# Patient Record
Sex: Female | Born: 1995 | Race: White | Hispanic: Yes | Marital: Married | Smoking: Never smoker
Health system: Southern US, Community
[De-identification: ages and names within clinical notes are randomized; demographics above are authoritative.]

## PROBLEM LIST (undated history)

## (undated) DIAGNOSIS — G43909 Migraine, unspecified, not intractable, without status migrainosus: Secondary | ICD-10-CM

## (undated) DIAGNOSIS — R42 Dizziness and giddiness: Secondary | ICD-10-CM

## (undated) HISTORY — DX: Dizziness and giddiness: R42

## (undated) HISTORY — DX: Migraine, unspecified, not intractable, without status migrainosus: G43.909

## (undated) HISTORY — PX: OTHER SURGICAL HISTORY: SHX169

---
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
## 2014-01-08 ENCOUNTER — Ambulatory Visit: Payer: BC Managed Care – PPO | Admitting: Neurology

## 2014-01-15 ENCOUNTER — Ambulatory Visit
Admission: RE | Admit: 2014-01-15 | Discharge: 2014-01-15 | Disposition: A | Discharge status: Home / Self Care | Payer: BC Managed Care – PPO | Source: Ambulatory Visit | Attending: Otolaryngology | Admitting: Otolaryngology

## 2014-01-15 ENCOUNTER — Ambulatory Visit (INDEPENDENT_AMBULATORY_CARE_PROVIDER_SITE_OTHER): Payer: BC Managed Care – PPO | Admitting: Women's Health

## 2014-01-15 ENCOUNTER — Encounter: Payer: Self-pay | Admitting: Women's Health

## 2014-01-15 ENCOUNTER — Other Ambulatory Visit: Payer: Self-pay | Admitting: Otolaryngology

## 2014-01-15 VITALS — BP 120/80 | Ht 63.0 in | Wt 134.0 lb

## 2014-01-15 DIAGNOSIS — B3731 Acute candidiasis of vulva and vagina: Secondary | ICD-10-CM

## 2014-01-15 DIAGNOSIS — Z30018 Encounter for initial prescription of other contraceptives: Secondary | ICD-10-CM

## 2014-01-15 DIAGNOSIS — N898 Other specified noninflammatory disorders of vagina: Secondary | ICD-10-CM

## 2014-01-15 DIAGNOSIS — J328 Other chronic sinusitis: Secondary | ICD-10-CM

## 2014-01-15 DIAGNOSIS — Z01419 Encounter for gynecological examination (general) (routine) without abnormal findings: Secondary | ICD-10-CM

## 2014-01-15 DIAGNOSIS — B373 Candidiasis of vulva and vagina: Secondary | ICD-10-CM

## 2014-01-15 LAB — CBC WITH DIFFERENTIAL/PLATELET
BASOS PCT: 0 % (ref 0–1)
Basophils Absolute: 0 10*3/uL (ref 0.0–0.1)
Eosinophils Absolute: 0.1 10*3/uL (ref 0.0–0.7)
Eosinophils Relative: 2 % (ref 0–5)
HEMATOCRIT: 40.2 % (ref 36.0–46.0)
Hemoglobin: 13.2 g/dL (ref 12.0–15.0)
Lymphocytes Relative: 33 % (ref 12–46)
Lymphs Abs: 2.4 10*3/uL (ref 0.7–4.0)
MCH: 28.4 pg (ref 26.0–34.0)
MCHC: 32.8 g/dL (ref 30.0–36.0)
MCV: 86.5 fL (ref 78.0–100.0)
MONO ABS: 0.5 10*3/uL (ref 0.1–1.0)
Monocytes Relative: 7 % (ref 3–12)
NEUTROS ABS: 4.2 10*3/uL (ref 1.7–7.7)
Neutrophils Relative %: 58 % (ref 43–77)
Platelets: 263 10*3/uL (ref 150–400)
RBC: 4.65 MIL/uL (ref 3.87–5.11)
RDW: 12.9 % (ref 11.5–15.5)
WBC: 7.3 10*3/uL (ref 4.0–10.5)

## 2014-01-15 LAB — WET PREP FOR TRICH, YEAST, CLUE: Trich, Wet Prep: NONE SEEN

## 2014-01-15 MED ORDER — NORELGESTROMIN-ETH ESTRADIOL 150-35 MCG/24HR TD PTWK: 1.0000 | MEDICATED_PATCH | TRANSDERMAL | Status: DC

## 2014-01-15 MED ORDER — FLUCONAZOLE 150 MG PO TABS: 150.0000 mg | ORAL_TABLET | Freq: Once | ORAL | Status: DC

## 2014-01-15 NOTE — Progress Notes (Signed)
[facility] 12/20/1995 [id number]    History:    Presents for annual exam.  Regular monthly cycle on OCs. Migraines that  are now relating more to sinus problems. Same partner. Completed gardasil series.  Past medical history, past surgical history, family history and social history were all reviewed and documented in the EPIC chart. [employer] nursing major. Parents healthy.  ROS:  A  12 point ROS was performed and pertinent positives and negatives are included.  Exam:  Filed Vitals:   01/15/14 1142  BP: 120/80    General appearance:  Normal Thyroid:  Symmetrical, normal in size, without palpable masses or nodularity. Respiratory  Auscultation:  Clear without wheezing or rhonchi Cardiovascular  Auscultation:  Regular rate, without rubs, murmurs or gallops  Edema/varicosities:  Not grossly evident Abdominal  Soft,nontender, without masses, guarding or rebound.  Liver/spleen:  No organomegaly noted  Hernia:  None appreciated  Skin  Inspection:  Grossly normal   Breasts: Examined lying and sitting.     Right: Without masses, retractions, discharge or axillary adenopathy.     Left: Without masses, retractions, discharge or axillary adenopathy. Gentitourinary   Inguinal/mons:  Normal without inguinal adenopathy  External genitalia:  Normal  BUS/Urethra/Skene's glands:  Normal  Vagina: erythematous with curdy discharge, wet prep positive for yeast, few clues  Cervix:  Normal  Uterus:   normal in size, shape and contour.  Midline and mobile  Adnexa/parametria:     Rt: Without masses or tenderness.   Lt: Without masses or tenderness.  Anus and perineum: Normal   Assessment/Plan:  18 y.o. SHFG0 for annual exam with complaint of occasional vaginal irritation, no odor, minimal itching.  Yeast vaginitis Headaches Contraception management  Plan: Diflucan 150 by mouth times one dose with refill, instructed to call if no relief of discomfort. Contraception options reviewed  will try Ortho Evra patch one patch weekly for 3 weeks, prescription, proper use given and reviewed slight risk for blood clots and strokes with history of migraines. SBE's, regular exercise, calcium rich diet, MVI daily encouraged. CBC, UHarrington Challenger.   Mahad Newstrom J WHNP, 12:21 PM 01/15/2014

## 2014-01-15 NOTE — Patient Instructions (Signed)

## 2015-01-20 ENCOUNTER — Encounter: Payer: Self-pay | Admitting: Women's Health

## 2015-01-24 ENCOUNTER — Other Ambulatory Visit: Payer: Self-pay | Admitting: Women's Health

## 2015-01-27 ENCOUNTER — Ambulatory Visit (INDEPENDENT_AMBULATORY_CARE_PROVIDER_SITE_OTHER): Payer: BLUE CROSS/BLUE SHIELD | Admitting: Women's Health

## 2015-01-27 ENCOUNTER — Encounter: Payer: Self-pay | Admitting: Women's Health

## 2015-01-27 VITALS — BP 124/80 | Ht 63.0 in | Wt 146.0 lb

## 2015-01-27 DIAGNOSIS — Z01419 Encounter for gynecological examination (general) (routine) without abnormal findings: Secondary | ICD-10-CM

## 2015-01-27 DIAGNOSIS — Z30019 Encounter for initial prescription of contraceptives, unspecified: Secondary | ICD-10-CM

## 2015-01-27 LAB — CBC WITH DIFFERENTIAL/PLATELET
BASOS ABS: 0 10*3/uL (ref 0.0–0.1)
Basophils Relative: 0 % (ref 0–1)
Eosinophils Absolute: 0.3 10*3/uL (ref 0.0–0.7)
Eosinophils Relative: 4 % (ref 0–5)
HEMATOCRIT: 38.2 % (ref 36.0–46.0)
Hemoglobin: 12.7 g/dL (ref 12.0–15.0)
LYMPHS ABS: 2.3 10*3/uL (ref 0.7–4.0)
LYMPHS PCT: 34 % (ref 12–46)
MCH: 27.5 pg (ref 26.0–34.0)
MCHC: 33.2 g/dL (ref 30.0–36.0)
MCV: 82.7 fL (ref 78.0–100.0)
MPV: 11.1 fL (ref 8.6–12.4)
Monocytes Absolute: 0.7 10*3/uL (ref 0.1–1.0)
Monocytes Relative: 10 % (ref 3–12)
NEUTROS ABS: 3.5 10*3/uL (ref 1.7–7.7)
NEUTROS PCT: 52 % (ref 43–77)
Platelets: 280 10*3/uL (ref 150–400)
RBC: 4.62 MIL/uL (ref 3.87–5.11)
RDW: 13.3 % (ref 11.5–15.5)
WBC: 6.7 10*3/uL (ref 4.0–10.5)

## 2015-01-27 MED ORDER — NORELGESTROMIN-ETH ESTRADIOL 150-35 MCG/24HR TD PTWK: MEDICATED_PATCH | TRANSDERMAL | Status: DC

## 2015-01-27 NOTE — Patient Instructions (Signed)
Health Maintenance, Female Adopting a healthy lifestyle and getting preventive care can go a long way to promote health and wellness. Talk with your health care provider about what schedule of regular examinations is right for you. This is a good chance for you to check in with your provider about disease prevention and staying healthy. In between checkups, there are plenty of things you can do on your own. Experts have done a lot of research about which lifestyle changes and preventive measures are most likely to keep you healthy. Ask your health care provider for more information. WEIGHT AND DIET  Eat a healthy diet  Be sure to include plenty of vegetables, fruits, low-fat dairy products, and lean protein.  Do not eat a lot of foods high in solid fats, added sugars, or salt.  Get regular exercise. This is one of the most important things you can do for your health.  Most adults should exercise for at least 150 minutes each week. The exercise should increase your heart rate and make you sweat (moderate-intensity exercise).  Most adults should also do strengthening exercises at least twice a week. This is in addition to the moderate-intensity exercise.  Maintain a healthy weight  Body mass index (BMI) is a measurement that can be used to identify possible weight problems. It estimates body fat based on height and weight. Your health care provider can help determine your BMI and help you achieve or maintain a healthy weight.  For females 20 years of age and older:   A BMI below 18.5 is considered underweight.  A BMI of 18.5 to 24.9 is normal.  A BMI of 25 to 29.9 is considered overweight.  A BMI of 30 and above is considered obese.  Watch levels of cholesterol and blood lipids  You should start having your blood tested for lipids and cholesterol at 20 years of age, then have this test every 5 years.  You may need to have your cholesterol levels checked more often if:  Your lipid  or cholesterol levels are high.  You are older than 20 years of age.  You are at high risk for heart disease.  CANCER SCREENING   Lung Cancer  Lung cancer screening is recommended for adults 55-80 years old who are at high risk for lung cancer because of a history of smoking.  A yearly low-dose CT scan of the lungs is recommended for people who:  Currently smoke.  Have quit within the past 15 years.  Have at least a 30-pack-year history of smoking. A pack year is smoking an average of one pack of cigarettes a day for 1 year.  Yearly screening should continue until it has been 15 years since you quit.  Yearly screening should stop if you develop a health problem that would prevent you from having lung cancer treatment.  Breast Cancer  Practice breast self-awareness. This means understanding how your breasts normally appear and feel.  It also means doing regular breast self-exams. Let your health care provider know about any changes, no matter how small.  If you are in your 20s or 30s, you should have a clinical breast exam (CBE) by a health care provider every 1-3 years as part of a regular health exam.  If you are 40 or older, have a CBE every year. Also consider having a breast X-ray (mammogram) every year.  If you have a family history of breast cancer, talk to your health care provider about genetic screening.  If you   are at high risk for breast cancer, talk to your health care provider about having an MRI and a mammogram every year.  Breast cancer gene (BRCA) assessment is recommended for women who have family members with BRCA-related cancers. BRCA-related cancers include:  Breast.  Ovarian.  Tubal.  Peritoneal cancers.  Results of the assessment will determine the need for genetic counseling and BRCA1 and BRCA2 testing. Cervical Cancer Your health care provider may recommend that you be screened regularly for cancer of the pelvic organs (ovaries, uterus, and  vagina). This screening involves a pelvic examination, including checking for microscopic changes to the surface of your cervix (Pap test). You may be encouraged to have this screening done every 3 years, beginning at age 42.  For women ages 68-65, health care providers may recommend pelvic exams and Pap testing every 3 years, or they may recommend the Pap and pelvic exam, combined with testing for human papilloma virus (HPV), every 5 years. Some types of HPV increase your risk of cervical cancer. Testing for HPV may also be done on women of any age with unclear Pap test results.  Other health care providers may not recommend any screening for nonpregnant women who are considered low risk for pelvic cancer and who do not have symptoms. Ask your health care provider if a screening pelvic exam is right for you.  If you have had past treatment for cervical cancer or a condition that could lead to cancer, you need Pap tests and screening for cancer for at least 20 years after your treatment. If Pap tests have been discontinued, your risk factors (such as having a new sexual partner) need to be reassessed to determine if screening should resume. Some women have medical problems that increase the chance of getting cervical cancer. In these cases, your health care provider may recommend more frequent screening and Pap tests. Colorectal Cancer  This type of cancer can be detected and often prevented.  Routine colorectal cancer screening usually begins at 20 years of age and continues through 19 years of age.  Your health care provider may recommend screening at an earlier age if you have risk factors for colon cancer.  Your health care provider may also recommend using home test kits to check for hidden blood in the stool.  A small camera at the end of a tube can be used to examine your colon directly (sigmoidoscopy or colonoscopy). This is done to check for the earliest forms of colorectal  cancer.  Routine screening usually begins at age 22.  Direct examination of the colon should be repeated every 5-10 years through 21 years of age. However, you may need to be screened more often if early forms of precancerous polyps or small growths are found. Skin Cancer  Check your skin from head to toe regularly.  Tell your health care provider about any new moles or changes in moles, especially if there is a change in a mole's shape or color.  Also tell your health care provider if you have a mole that is larger than the size of a pencil eraser.  Always use sunscreen. Apply sunscreen liberally and repeatedly throughout the day.  Protect yourself by wearing long sleeves, pants, a wide-brimmed hat, and sunglasses whenever you are outside. HEART DISEASE, DIABETES, AND HIGH BLOOD PRESSURE   High blood pressure causes heart disease and increases the risk of stroke. High blood pressure is more likely to develop in:  People who have blood pressure in the high end  of the normal range (130-139/85-89 mm Hg).  People who are overweight or obese.  People who are African American.  If you are 38-23 years of age, have your blood pressure checked every 3-5 years. If you are 61 years of age or older, have your blood pressure checked every year. You should have your blood pressure measured twice--once when you are at a hospital or clinic, and once when you are not at a hospital or clinic. Record the average of the two measurements. To check your blood pressure when you are not at a hospital or clinic, you can use:  An automated blood pressure machine at a pharmacy.  A home blood pressure monitor.  If you are between 45 years and 39 years old, ask your health care provider if you should take aspirin to prevent strokes.  Have regular diabetes screenings. This involves taking a blood sample to check your fasting blood sugar level.  If you are at a normal weight and have a low risk for diabetes,  have this test once every three years after 20 years of age.  If you are overweight and have a high risk for diabetes, consider being tested at a younger age or more often. PREVENTING INFECTION  Hepatitis B  If you have a higher risk for hepatitis B, you should be screened for this virus. You are considered at high risk for hepatitis B if:  You were born in a country where hepatitis B is common. Ask your health care provider which countries are considered high risk.  Your parents were born in a high-risk country, and you have not been immunized against hepatitis B (hepatitis B vaccine).  You have HIV or AIDS.  You use needles to inject street drugs.  You live with someone who has hepatitis B.  You have had sex with someone who has hepatitis B.  You get hemodialysis treatment.  You take certain medicines for conditions, including cancer, organ transplantation, and autoimmune conditions. Hepatitis C  Blood testing is recommended for:  Everyone born from [birth year] through 1966.  Anyone with known risk factors for hepatitis C. Sexually transmitted infections (STIs)  You should be screened for sexually transmitted infections (STIs) including gonorrhea and chlamydia if:  You are sexually active and are younger than 20 years of age.  You are older than 20 years of age and your health care provider tells you that you are at risk for this type of infection.  Your sexual activity has changed since you were last screened and you are at an increased risk for chlamydia or gonorrhea. Ask your health care provider if you are at risk.  If you do not have HIV, but are at risk, it may be recommended that you take a prescription medicine daily to prevent HIV infection. This is called pre-exposure prophylaxis (PrEP). You are considered at risk if:  You are sexually active and do not regularly use condoms or know the HIV status of your partner(s).  You take drugs by injection.  You are sexually  active with a partner who has HIV. Talk with your health care provider about whether you are at high risk of being infected with HIV. If you choose to begin PrEP, you should first be tested for HIV. You should then be tested every 3 months for as long as you are taking PrEP.  PREGNANCY   If you are premenopausal and you may become pregnant, ask your health care provider about preconception counseling.  If you may  become pregnant, take 400 to 800 micrograms (mcg) of folic acid every day.  If you want to prevent pregnancy, talk to your health care provider about birth control (contraception). OSTEOPOROSIS AND MENOPAUSE   Osteoporosis is a disease in which the bones lose minerals and strength with aging. This can result in serious bone fractures. Your risk for osteoporosis can be identified using a bone density scan.  If you are 61 years of age or older, or if you are at risk for osteoporosis and fractures, ask your health care provider if you should be screened.  Ask your health care provider whether you should take a calcium or vitamin D supplement to lower your risk for osteoporosis.  Menopause may have certain physical symptoms and risks.  Hormone replacement therapy may reduce some of these symptoms and risks. Talk to your health care provider about whether hormone replacement therapy is right for you.  HOME CARE INSTRUCTIONS   Schedule regular health, dental, and eye exams.  Stay current with your immunizations.   Do not use any tobacco products including cigarettes, chewing tobacco, or electronic cigarettes.  If you are pregnant, do not drink alcohol.  If you are breastfeeding, limit how much and how often you drink alcohol.  Limit alcohol intake to no more than 1 drink per day for nonpregnant women. One drink equals 12 ounces of beer, 5 ounces of wine, or 1 ounces of hard liquor.  Do not use street drugs.  Do not share needles.  Ask your health care provider for help if  you need support or information about quitting drugs.  Tell your health care provider if you often feel depressed.  Tell your health care provider if you have ever been abused or do not feel safe at home.   This information is not intended to replace advice given to you by your health care provider. Make sure you discuss any questions you have with your health care provider.   Document Released: 09/11/2010 Document Revised: 03/19/2014 Document Reviewed: 01/28/2013 Elsevier Interactive Patient Education [employer].

## 2015-01-27 NOTE — Progress Notes (Signed)
Tonya Huynh Tonya Huynh 03/14/1995 [id number]    History:    Presents for annual exam.  Monthly cycle on Ortho Evra patch without complaint. Same partner/denies need for STD screen. Gardasil series completed.  Past medical history, past surgical history, family history and social history were all reviewed and documented in the EPIC chart. [facility] [employer] [facility] well. Parents healthy.  ROS:  A ROS was performed and pertinent positives and negatives are included.  Exam:  Filed Vitals:   01/27/15 0830  BP: 124/80    General appearance:  Normal Thyroid:  Symmetrical, normal in size, without palpable masses or nodularity. Respiratory  Auscultation:  Clear without wheezing or rhonchi Cardiovascular  Auscultation:  Regular rate, without rubs, murmurs or gallops  Edema/varicosities:  Not grossly evident Abdominal  Soft,nontender, without masses, guarding or rebound.  Liver/spleen:  No organomegaly noted  Hernia:  None appreciated  Skin  Inspection:  Grossly normal   Breasts: Examined lying and sitting.     Right: Without masses, retractions, discharge or axillary adenopathy.     Left: Without masses, retractions, discharge or axillary adenopathy. Gentitourinary   Inguinal/mons:  Normal without inguinal adenopathy  External genitalia:  Normal  BUS/Urethra/Skene's glands:  Normal  Vagina:  Normal  Cervix:  Normal  Uterus:  normal in size, shape and contour.  Midline and mobile  Adnexa/parametria:     Rt: Without masses or tenderness.   Lt: Without masses or tenderness.  Anus and perineum: Normal    Assessment/Plan:  19 y.o. SHF  G0 for annual exam with complaint of questionable discoloration of left hand.  Monthly cycle on Ortho Evra patch/same partner  Plan: Ortho Evra patch prescription, proper use, slight risk for blood clots and strokes reviewed, condoms encouraged until permanent partner. SBE's, regular exercise, calcium rich diet, decrease calories for weight loss  encouraged, has gained 15 pounds in the past year. CBC, UA, Flat, pinpoint light brown discoloration without itching or discomfort on left hand may be more sun related. Encouraged sunscreen on hands.    Harrington ChallengerYOUNG,NANCY J [facility] HuntingtonWHNP, 20:09 AM 01/27/2015

## 2015-01-28 LAB — URINALYSIS W MICROSCOPIC + REFLEX CULTURE
BILIRUBIN URINE: NEGATIVE
Casts: NONE SEEN [LPF]
Crystals: NONE SEEN [HPF]
GLUCOSE, UA: NEGATIVE
HGB URINE DIPSTICK: NEGATIVE
Ketones, ur: NEGATIVE
LEUKOCYTES UA: NEGATIVE
NITRITE: NEGATIVE
PH: 6 (ref 5.0–8.0)
PROTEIN: NEGATIVE
Specific Gravity, Urine: 1.022 (ref 1.001–1.035)
YEAST: NONE SEEN [HPF]

## 2015-01-29 LAB — URINE CULTURE

## 2015-04-22 ENCOUNTER — Other Ambulatory Visit: Payer: Self-pay | Admitting: Women's Health

## 2015-06-16 IMAGING — CR DG SINUSES COMPLETE 3+V
3 series · 3 of 3 positions shown · non-contrast
Comparison: None.

CLINICAL DATA: Chronic sinusitis ; 1 history of maxillary sinus
region pain, more severe on the left than on the right

EXAM:
PARANASAL SINUSES - COMPLETE 3 + VIEW

[[person_name]]
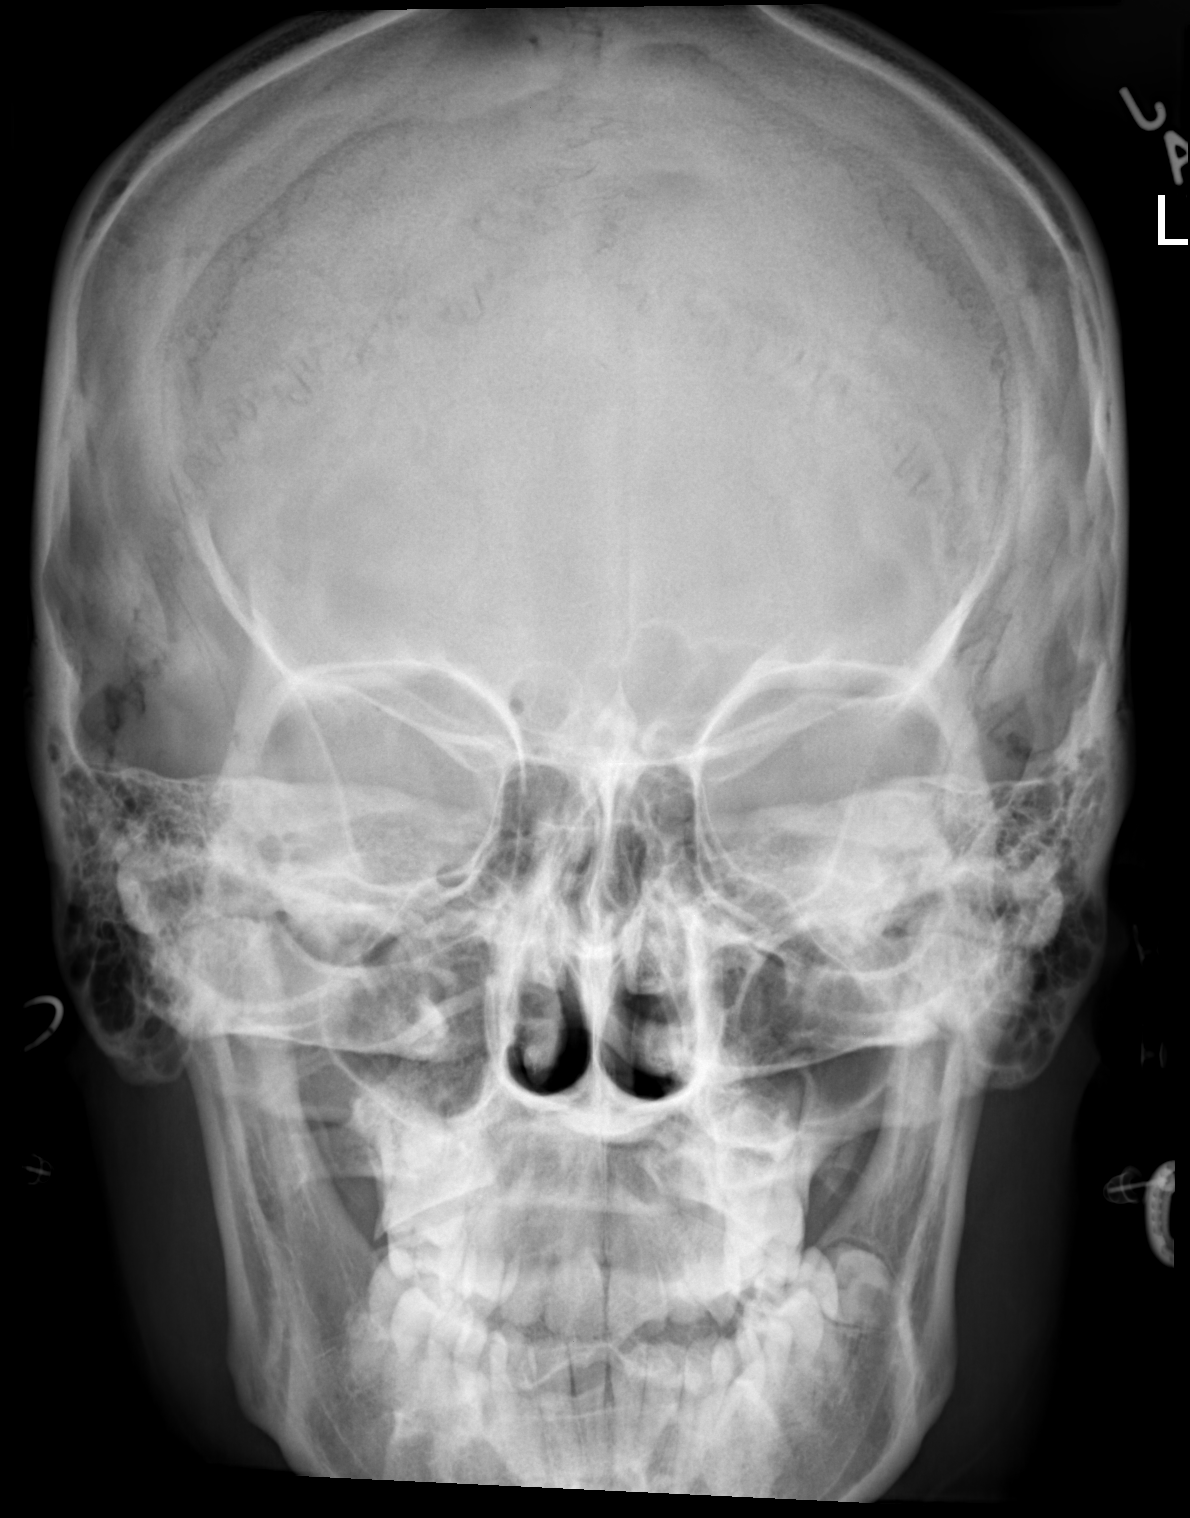

[w waters]
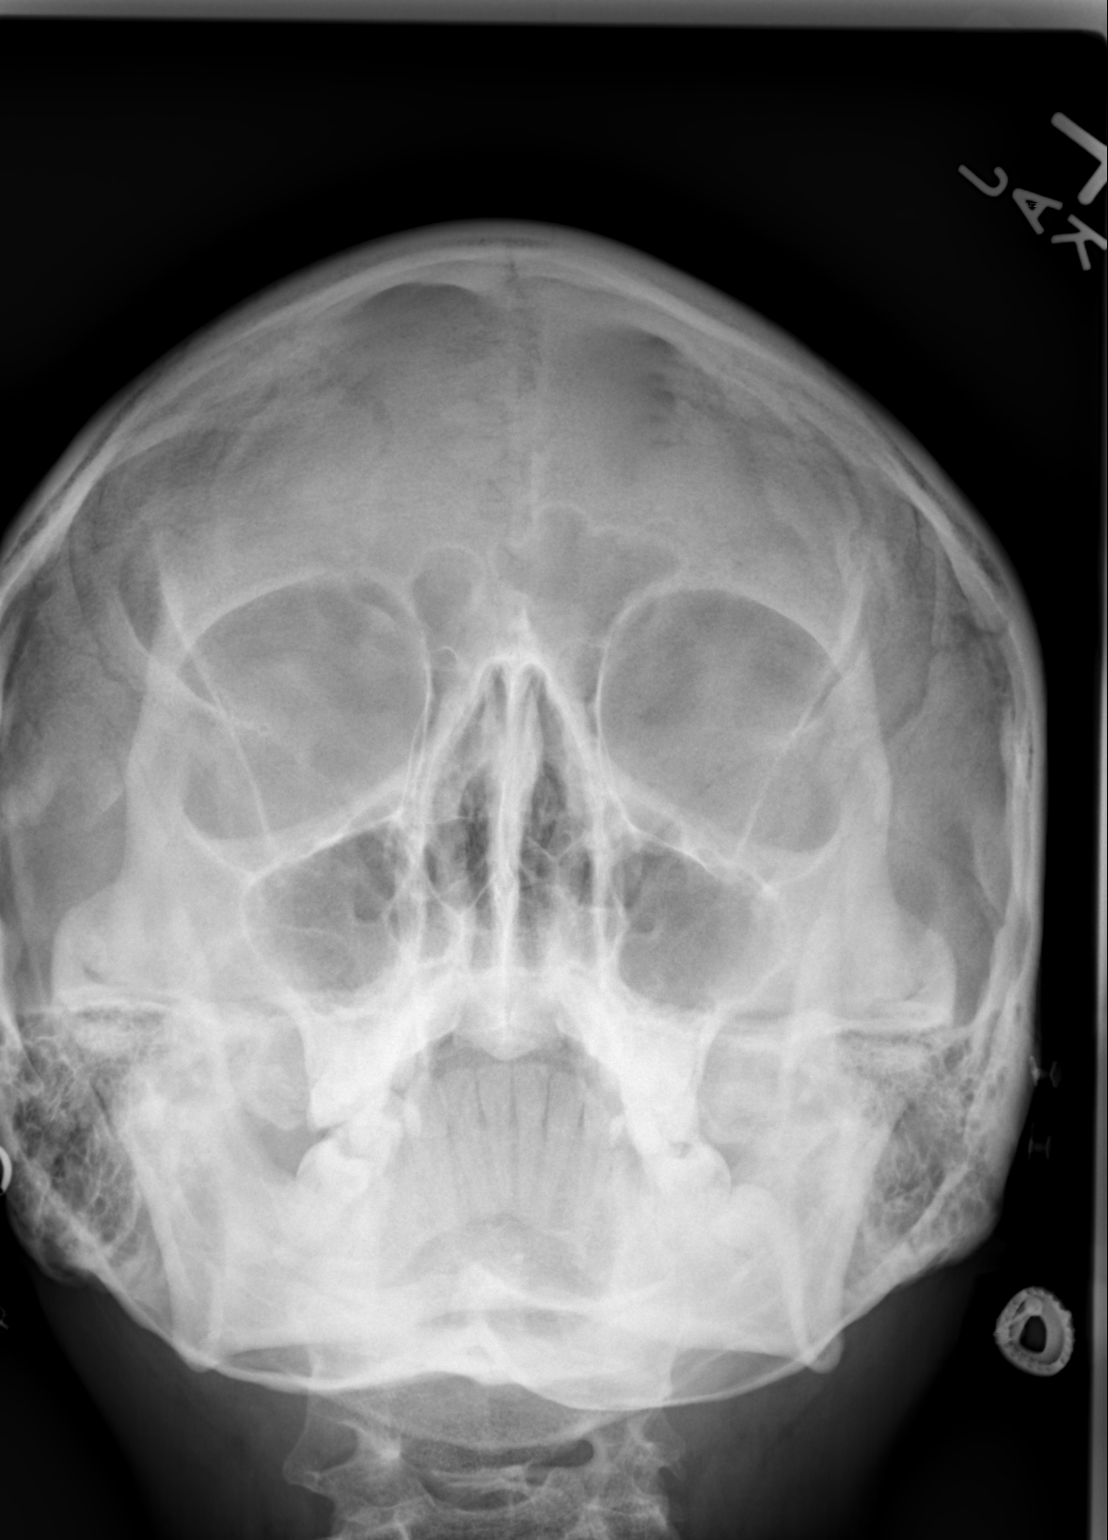

[w skull lat]
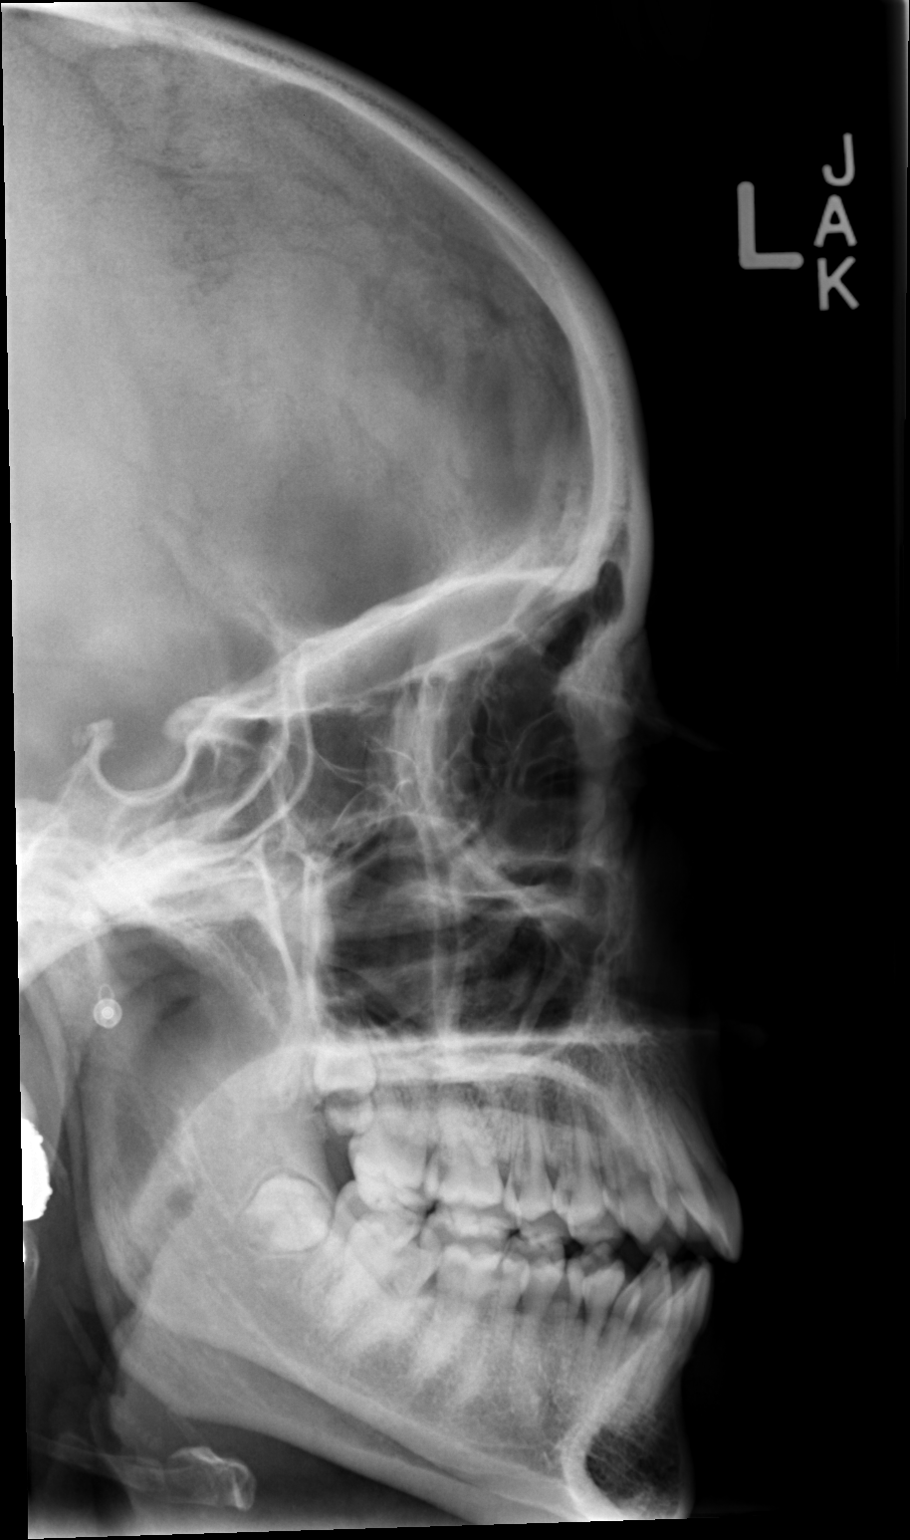

[3 of 3 positions shown; findings below may reference images not displayed]

FINDINGS: Ceejay, Paulus N, and lateral views were obtained. Frontal sinuses
are rather hypoplastic. Aerated paranasal sinuses are clear. There
is no air-fluid level. No bony destruction or expansion. Nasal
septum is midline.
IMPRESSION: Aerated paranasal sinuses clear.  No bony destruction or expansion.

## 2016-01-04 ENCOUNTER — Telehealth: Payer: Self-pay | Admitting: *Deleted

## 2016-01-04 NOTE — Telephone Encounter (Signed)
[place] asked if we carry in vaccinations needed for trip.I explained to pt best to contact local health department to get this information as they have the most up to date information.

## 2016-01-31 ENCOUNTER — Encounter: Payer: Self-pay | Admitting: Women's Health

## 2016-01-31 ENCOUNTER — Ambulatory Visit (INDEPENDENT_AMBULATORY_CARE_PROVIDER_SITE_OTHER): Payer: BLUE CROSS/BLUE SHIELD | Admitting: Women's Health

## 2016-01-31 VITALS — BP 122/78 | Ht 63.0 in | Wt 144.0 lb

## 2016-01-31 DIAGNOSIS — Z30016 Encounter for initial prescription of transdermal patch hormonal contraceptive device: Secondary | ICD-10-CM

## 2016-01-31 DIAGNOSIS — B9689 Other specified bacterial agents as the cause of diseases classified elsewhere: Secondary | ICD-10-CM | POA: Diagnosis not present

## 2016-01-31 DIAGNOSIS — Z113 Encounter for screening for infections with a predominantly sexual mode of transmission: Secondary | ICD-10-CM

## 2016-01-31 DIAGNOSIS — N898 Other specified noninflammatory disorders of vagina: Secondary | ICD-10-CM

## 2016-01-31 DIAGNOSIS — N760 Acute vaginitis: Secondary | ICD-10-CM | POA: Diagnosis not present

## 2016-01-31 DIAGNOSIS — Z01419 Encounter for gynecological examination (general) (routine) without abnormal findings: Secondary | ICD-10-CM | POA: Diagnosis not present

## 2016-01-31 LAB — CBC WITH DIFFERENTIAL/PLATELET
BASOS PCT: 0 %
Basophils Absolute: 0 cells/uL (ref 0–200)
Eosinophils Absolute: 201 cells/uL (ref 15–500)
Eosinophils Relative: 3 %
HEMATOCRIT: 35.6 % (ref 35.0–45.0)
HEMOGLOBIN: 11.8 g/dL (ref 11.7–15.5)
LYMPHS ABS: 2479 {cells}/uL (ref 850–3900)
LYMPHS PCT: 37 %
MCH: 28 pg (ref 27.0–33.0)
MCHC: 33.1 g/dL (ref 32.0–36.0)
MCV: 84.6 fL (ref 80.0–100.0)
MONO ABS: 603 {cells}/uL (ref 200–950)
MPV: 11.1 fL (ref 7.5–12.5)
Monocytes Relative: 9 %
Neutro Abs: 3417 cells/uL (ref 1500–7800)
Neutrophils Relative %: 51 %
Platelets: 251 10*3/uL (ref 140–400)
RBC: 4.21 MIL/uL (ref 3.80–5.10)
RDW: 13.4 % (ref 11.0–15.0)
WBC: 6.7 10*3/uL (ref 3.8–10.8)

## 2016-01-31 LAB — WET PREP FOR TRICH, YEAST, CLUE
Trich, Wet Prep: NONE SEEN
Yeast Wet Prep HPF POC: NONE SEEN

## 2016-01-31 LAB — HIV ANTIBODY (ROUTINE TESTING W REFLEX): HIV: NONREACTIVE

## 2016-01-31 MED ORDER — METRONIDAZOLE 0.75 % VA GEL: VAGINAL | 0 refills | Status: DC

## 2016-01-31 MED ORDER — NORELGESTROMIN-ETH ESTRADIOL 150-35 MCG/24HR TD PTWK: MEDICATED_PATCH | TRANSDERMAL | 4 refills | Status: DC

## 2016-01-31 MED ORDER — FLUCONAZOLE 150 MG PO TABS: 150.0000 mg | ORAL_TABLET | Freq: Once | ORAL | 1 refills | Status: AC

## 2016-01-31 NOTE — Patient Instructions (Addendum)
Health Maintenance, Female Introduction Adopting a healthy lifestyle and getting preventive care can go a long way to promote health and wellness. Talk with your health care provider about what schedule of regular examinations is right for you. This is a good chance for you to check in with your provider about disease prevention and staying healthy. In between checkups, there are plenty of things you can do on your own. Experts have done a lot of research about which lifestyle changes and preventive measures are most likely to keep you healthy. Ask your health care provider for more information. Weight and diet Eat a healthy diet  Be sure to include plenty of vegetables, fruits, low-fat dairy products, and lean protein.  Do not eat a lot of foods high in solid fats, added sugars, or salt.  Get regular exercise. This is one of the most important things you can do for your health.  Most adults should exercise for at least 150 minutes each week. The exercise should increase your heart rate and make you sweat (moderate-intensity exercise).  Most adults should also do strengthening exercises at least twice a week. This is in addition to the moderate-intensity exercise. Maintain a healthy weight  Body mass index (BMI) is a measurement that can be used to identify possible weight problems. It estimates body fat based on height and weight. Your health care provider can help determine your BMI and help you achieve or maintain a healthy weight.  For females 50 years of age and older:  A BMI below 18.5 is considered underweight.  A BMI of 18.5 to 24.9 is normal.  A BMI of 25 to 29.9 is considered overweight.  A BMI of 30 and above is considered obese. Watch levels of cholesterol and blood lipids  You should start having your blood tested for lipids and cholesterol at 21 years of age, then have this test every 5 years.  You may need to have your cholesterol levels checked more often if:  Your  lipid or cholesterol levels are high.  You are older than 21 years of age.  You are at high risk for heart disease. Cancer screening Lung Cancer  Lung cancer screening is recommended for adults 20-30 years old who are at high risk for lung cancer because of a history of smoking.  A yearly low-dose CT scan of the lungs is recommended for people who:  Currently smoke.  Have quit within the past 15 years.  Have at least a 30-pack-year history of smoking. A pack year is smoking an average of one pack of cigarettes a day for 1 year.  Yearly screening should continue until it has been 15 years since you quit.  Yearly screening should stop if you develop a health problem that would prevent you from having lung cancer treatment. Breast Cancer  Practice breast self-awareness. This means understanding how your breasts normally appear and feel.  It also means doing regular breast self-exams. Let your health care provider know about any changes, no matter how small.  If you are in your 20s or 30s, you should have a clinical breast exam (CBE) by a health care provider every 1-3 years as part of a regular health exam.  If you are 74 or older, have a CBE every year. Also consider having a breast X-ray (mammogram) every year.  If you have a family history of breast cancer, talk to your health care provider about genetic screening.  If you are at high risk for breast cancer,  talk to your health care provider about having an MRI and a mammogram every year.  Breast cancer gene (BRCA) assessment is recommended for women who have family members with BRCA-related cancers. BRCA-related cancers include:  Breast.  Ovarian.  Tubal.  Peritoneal cancers.  Results of the assessment will determine the need for genetic counseling and BRCA1 and BRCA2 testing. Cervical Cancer  Your health care provider may recommend that you be screened regularly for cancer of the pelvic organs (ovaries, uterus, and  vagina). This screening involves a pelvic examination, including checking for microscopic changes to the surface of your cervix (Pap test). You may be encouraged to have this screening done every 3 years, beginning at age 106.  For women ages 49-65, health care providers may recommend pelvic exams and Pap testing every 3 years, or they may recommend the Pap and pelvic exam, combined with testing for human papilloma virus (HPV), every 5 years. Some types of HPV increase your risk of cervical cancer. Testing for HPV may also be done on women of any age with unclear Pap test results.  Other health care providers may not recommend any screening for nonpregnant women who are considered low risk for pelvic cancer and who do not have symptoms. Ask your health care provider if a screening pelvic exam is right for you.  If you have had past treatment for cervical cancer or a condition that could lead to cancer, you need Pap tests and screening for cancer for at least 20 years after your treatment. If Pap tests have been discontinued, your risk factors (such as having a new sexual partner) need to be reassessed to determine if screening should resume. Some women have medical problems that increase the chance of getting cervical cancer. In these cases, your health care provider may recommend more frequent screening and Pap tests. Colorectal Cancer  This type of cancer can be detected and often prevented.  Routine colorectal cancer screening usually begins at 21 years of age and continues through 21 years of age.  Your health care provider may recommend screening at an earlier age if you have risk factors for colon cancer.  Your health care provider may also recommend using home test kits to check for hidden blood in the stool.  A small camera at the end of a tube can be used to examine your colon directly (sigmoidoscopy or colonoscopy). This is done to check for the earliest forms of colorectal  cancer.  Routine screening usually begins at age 54.  Direct examination of the colon should be repeated every 5-10 years through 21 years of age. However, you may need to be screened more often if early forms of precancerous polyps or small growths are found. Skin Cancer  Check your skin from head to toe regularly.  Tell your health care provider about any new moles or changes in moles, especially if there is a change in a mole's shape or color.  Also tell your health care provider if you have a mole that is larger than the size of a pencil eraser.  Always use sunscreen. Apply sunscreen liberally and repeatedly throughout the day.  Protect yourself by wearing long sleeves, pants, a wide-brimmed hat, and sunglasses whenever you are outside. Heart disease, diabetes, and high blood pressure  High blood pressure causes heart disease and increases the risk of stroke. High blood pressure is more likely to develop in:  People who have blood pressure in the high end of the normal range (130-139/85-89 mm Hg).  People who are overweight or obese.  People who are African American.  If you are 18-39 years of age, have your blood pressure checked every 3-5 years. If you are 40 years of age or older, have your blood pressure checked every year. You should have your blood pressure measured twice-once when you are at a hospital or clinic, and once when you are not at a hospital or clinic. Record the average of the two measurements. To check your blood pressure when you are not at a hospital or clinic, you can use:  An automated blood pressure machine at a pharmacy.  A home blood pressure monitor.  If you are between 55 years and 79 years old, ask your health care provider if you should take aspirin to prevent strokes.  Have regular diabetes screenings. This involves taking a blood sample to check your fasting blood sugar level.  If you are at a normal weight and have a low risk for diabetes,  have this test once every three years after 21 years of age.  If you are overweight and have a high risk for diabetes, consider being tested at a younger age or more often. Preventing infection Hepatitis B  If you have a higher risk for hepatitis B, you should be screened for this virus. You are considered at high risk for hepatitis B if:  You were born in a country where hepatitis B is common. Ask your health care provider which countries are considered high risk.  Your parents were born in a high-risk country, and you have not been immunized against hepatitis B (hepatitis B vaccine).  You have HIV or AIDS.  You use needles to inject street drugs.  You live with someone who has hepatitis B.  You have had sex with someone who has hepatitis B.  You get hemodialysis treatment.  You take certain medicines for conditions, including cancer, organ transplantation, and autoimmune conditions. Hepatitis C  Blood testing is recommended for:  Everyone born from 1945 through 1965.  Anyone with known risk factors for hepatitis C. Sexually transmitted infections (STIs)  You should be screened for sexually transmitted infections (STIs) including gonorrhea and chlamydia if:  You are sexually active and are younger than 21 years of age.  You are older than 21 years of age and your health care provider tells you that you are at risk for this type of infection.  Your sexual activity has changed since you were last screened and you are at an increased risk for chlamydia or gonorrhea. Ask your health care provider if you are at risk.  If you do not have HIV, but are at risk, it may be recommended that you take a prescription medicine daily to prevent HIV infection. This is called pre-exposure prophylaxis (PrEP). You are considered at risk if:  You are sexually active and do not regularly use condoms or know the HIV status of your partner(s).  You take drugs by injection.  You are sexually  active with a partner who has HIV. Talk with your health care provider about whether you are at high risk of being infected with HIV. If you choose to begin PrEP, you should first be tested for HIV. You should then be tested every 3 months for as long as you are taking PrEP. Pregnancy  If you are premenopausal and you may become pregnant, ask your health care provider about preconception counseling.  If you may become pregnant, take 400 to 800 micrograms (mcg) of folic acid   every day.  If you want to prevent pregnancy, talk to your health care provider about birth control (contraception). Osteoporosis and menopause  Osteoporosis is a disease in which the bones lose minerals and strength with aging. This can result in serious bone fractures. Your risk for osteoporosis can be identified using a bone density scan.  If you are 73 years of age or older, or if you are at risk for osteoporosis and fractures, ask your health care provider if you should be screened.  Ask your health care provider whether you should take a calcium or vitamin D supplement to lower your risk for osteoporosis.  Menopause may have certain physical symptoms and risks.  Hormone replacement therapy may reduce some of these symptoms and risks. Talk to your health care provider about whether hormone replacement therapy is right for you. Follow these instructions at home:  Schedule regular health, dental, and eye exams.  Stay current with your immunizations.  Do not use any tobacco products including cigarettes, chewing tobacco, or electronic cigarettes.  If you are pregnant, do not drink alcohol.  If you are breastfeeding, limit how much and how often you drink alcohol.  Limit alcohol intake to no more than 1 drink per day for nonpregnant women. One drink equals 12 ounces of beer, 5 ounces of wine, or 1 ounces of hard liquor.  Do not use street drugs.  Do not share needles.  Ask your health care provider for  help if you need support or information about quitting drugs.  Tell your health care provider if you often feel depressed.  Tell your health care provider if you have ever been abused or do not feel safe at home. This information is not intended to replace advice given to you by your health care provider. Make sure you discuss any questions you have with your health care provider. Document Released: 09/11/2010 Document Revised: 08/04/2015 Document Reviewed: 11/30/2014  2017 Elsevier  Bacterial Vaginosis Bacterial vaginosis is an infection of the vagina. It happens when too many germs (bacteria) grow in the vagina. This infection puts you at risk for infections from sex (STIs). Treating this infection can lower your risk for some STIs. You should also treat this if you are pregnant. It can cause your baby to be born early. Follow these instructions at home: Medicines  Take over-the-counter and prescription medicines only as told by your doctor.  Take or use your antibiotic medicine as told by your doctor. Do not stop taking or using it even if you start to feel better. General instructions  If you your sexual partner is a woman, tell her that you have this infection. She needs to get treatment if she has symptoms. If you have a female partner, he does not need to be treated.  During treatment:  Avoid sex.  Do not douche.  Avoid alcohol as told.  Avoid breastfeeding as told.  Drink enough fluid to keep your pee (urine) clear or pale yellow.  Keep your vagina and butt (rectum) clean.  Wash the area with warm water every day.  Wipe from front to back after you use the toilet.  Keep all follow-up visits as told by your doctor. This is important. Preventing this condition  Do not douche.  Use only warm water to wash around your vagina.  Use protection when you have sex. This includes:  Latex condoms.  Dental dams.  Limit how many people you have sex with. It is best to  only have sex  with the same person (be monogamous).  Get tested for STIs. Have your partner get tested.  Wear underwear that is cotton or lined with cotton.  Avoid tight pants and pantyhose. This is most important in summer.  Do not use any products that have nicotine or tobacco in them. These include cigarettes and e-cigarettes. If you need help quitting, ask your doctor.  Do not use illegal drugs.  Limit how much alcohol you drink. Contact a doctor if:  Your symptoms do not get better, even after you are treated.  You have more discharge or pain when you pee (urinate).  You have a fever.  You have pain in your belly (abdomen).  You have pain with sex.  Your bleed from your vagina between periods. Summary  This infection happens when too many germs (bacteria) grow in the vagina.  Treating this condition can lower your risk for some infections from sex (STIs).  You should also treat this if you are pregnant. It can cause early (premature) birth.  Do not stop taking or using your antibiotic medicine even if you start to feel better. This information is not intended to replace advice given to you by your health care provider. Make sure you discuss any questions you have with your health care provider. Document Released: 12/06/2007 Document Revised: 11/12/2015 Document Reviewed: 11/12/2015 Elsevier Interactive Patient Education  2017 Elsevier Inc.  

## 2016-01-31 NOTE — Progress Notes (Signed)
[facility]arcelino DusterMichelle [facility] 07/22/1995 [id number]    History:    Presents for annual exam.  Monthly cycle on Ortho Evra patch. Gardasil series completed. New partner.  Past medical history, past surgical history, family history and social history were all reviewed and documented in the EPIC chart. 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 Parents healthy.  ROS:  A ROS was performed and pertinent positives and negatives are included.  Exam:  Vitals:   01/31/16 1056  BP: 122/78  Weight: 144 lb (65.3 kg)  Height: 5\' 3"  (1.6 m)   Body mass index is 25.51 kg/m.   General appearance:  Normal Thyroid:  Symmetrical, normal in size, without palpable masses or nodularity. Respiratory  Auscultation:  Clear without wheezing or rhonchi Cardiovascular  Auscultation:  Regular rate, without rubs, murmurs or gallops  Edema/varicosities:  Not grossly evident Abdominal  Soft,nontender, without masses, guarding or rebound.  Liver/spleen:  No organomegaly noted  Hernia:  None appreciated  Skin  Inspection:  Grossly normal   Breasts: Examined lying and sitting.     Right: Without masses, retractions, discharge or axillary adenopathy.     Left: Without masses, retractions, discharge or axillary adenopathy. Gentitourinary   Inguinal/mons:  Normal without inguinal adenopathy  External genitalia:  Normal  BUS/Urethra/Skene's glands:  Normal  Vagina:  Scant discharge, wet prep positive for few clues  Cervix:  Normal  Uterus:   normal in size, shape and contour.  Midline and mobile  Adnexa/parametria:     Rt: Without masses or tenderness.   Lt: Without masses or tenderness.  Anus and perineum: Normal    Assessment/Plan:  20 y.o. SHF G0 for annual exam complaint of occasional vaginal itching with discharge.   Monthly cycle on Ortho Evra patch STD screen Bacteria vaginosis  Plan: MetroGel vaginal cream 1 applicator at bedtime 5, alcohol precautions reviewed.  Diflucan 150 by mouth 1 dose if vaginal itching persists after treatment. Contraception options reviewed would like to continue on patch, Ortho Evra patch prescription, proper use, slight risk for blood clots and strokes reviewed. SBE's, exercise, calcium rich diet, MVI daily encouraged. Campus safety reviewed. CBC, UA, Pap, GC/Chlamydia, HIV, hep B, C, RPRHarrington Challenger.    YOUNG,NANCY J WHNP, 11:58 AM 01/31/2016

## 2016-02-01 LAB — HEPATITIS B SURFACE ANTIGEN: HEP B S AG: NEGATIVE

## 2016-02-01 LAB — RPR

## 2016-02-01 LAB — HEPATITIS C ANTIBODY: HCV AB: NEGATIVE

## 2016-02-06 LAB — PAP IG AND CT-NG NAA
Chlamydia Probe Amp: NOT DETECTED
GC Probe Amp: NOT DETECTED

## 2016-04-24 ENCOUNTER — Other Ambulatory Visit: Payer: Self-pay | Admitting: Women's Health

## 2016-07-25 ENCOUNTER — Encounter: Payer: Self-pay | Admitting: Gynecology

## 2016-08-02 ENCOUNTER — Telehealth: Payer: Self-pay | Admitting: *Deleted

## 2016-08-02 NOTE — Telephone Encounter (Signed)
(  pt aware you are out of the office) Pt currently using Xulane patch wishes to start back on oral birth control pills, c/o having issues with patch when sweating and working out. Pt would like to start back on Aubra 0.1-20 mcg.

## 2016-08-03 NOTE — Telephone Encounter (Signed)
Ok to switch back.  Finish out current box of patches and then start back on OC's when would start back on new box of patches.

## 2016-08-07 MED ORDER — LEVONORGESTREL-ETHINYL ESTRAD 0.1-20 MG-MCG PO TABS: 1.0000 | ORAL_TABLET | Freq: Every day | ORAL | 5 refills | Status: DC

## 2016-08-07 NOTE — Telephone Encounter (Signed)
Pt informed, Rx sent. 

## 2017-02-04 ENCOUNTER — Ambulatory Visit: Payer: BLUE CROSS/BLUE SHIELD | Admitting: Women's Health

## 2017-02-04 ENCOUNTER — Encounter: Payer: Self-pay | Admitting: Women's Health

## 2017-02-04 VITALS — BP 122/78 | Ht 63.0 in | Wt 158.0 lb

## 2017-02-04 DIAGNOSIS — Z01419 Encounter for gynecological examination (general) (routine) without abnormal findings: Secondary | ICD-10-CM

## 2017-02-04 DIAGNOSIS — R350 Frequency of micturition: Secondary | ICD-10-CM | POA: Diagnosis not present

## 2017-02-04 DIAGNOSIS — N898 Other specified noninflammatory disorders of vagina: Secondary | ICD-10-CM | POA: Diagnosis not present

## 2017-02-04 DIAGNOSIS — Z3041 Encounter for surveillance of contraceptive pills: Secondary | ICD-10-CM | POA: Diagnosis not present

## 2017-02-04 LAB — CBC WITH DIFFERENTIAL/PLATELET
Basophils Absolute: 33 cells/uL (ref 0–200)
Basophils Relative: 0.4 %
Eosinophils Absolute: 564 cells/uL — ABNORMAL HIGH (ref 15–500)
Eosinophils Relative: 6.8 %
HEMATOCRIT: 37.3 % (ref 35.0–45.0)
HEMOGLOBIN: 12.5 g/dL (ref 11.7–15.5)
LYMPHS ABS: 3046 {cells}/uL (ref 850–3900)
MCH: 28.1 pg (ref 27.0–33.0)
MCHC: 33.5 g/dL (ref 32.0–36.0)
MCV: 83.8 fL (ref 80.0–100.0)
MPV: 11.9 fL (ref 7.5–12.5)
Monocytes Relative: 7.7 %
NEUTROS ABS: 4017 {cells}/uL (ref 1500–7800)
NEUTROS PCT: 48.4 %
Platelets: 250 10*3/uL (ref 140–400)
RBC: 4.45 10*6/uL (ref 3.80–5.10)
RDW: 12.4 % (ref 11.0–15.0)
Total Lymphocyte: 36.7 %
WBC: 8.3 10*3/uL (ref 3.8–10.8)
WBCMIX: 639 {cells}/uL (ref 200–950)

## 2017-02-04 LAB — WET PREP FOR TRICH, YEAST, CLUE

## 2017-02-04 MED ORDER — LEVONORGESTREL-ETHINYL ESTRAD 0.1-20 MG-MCG PO TABS: ORAL_TABLET | ORAL | 4 refills | Status: DC

## 2017-02-04 NOTE — Patient Instructions (Signed)

## 2017-02-04 NOTE — Progress Notes (Signed)
[facility]arcelino DusterMichelle [facility] 03/04/1996 [id number]    History:    Presents for annual exam with plan of occasional dyspareunia. Monthly cycle on Loestrin. Gardasil series completed. Normal Pap. Negative STD screen with current partner. Denies urinary frequency, urgency or burning.  Past medical history, past surgical history, family history and social history were all reviewed and documented in the EPIC chart. Nurse ICU. Parents healthy. [place], enjoys traveling adventure and boyfriend does not.  ROS:  A ROS was performed and pertinent positives and negatives are included.  Exam:  Vitals:   02/04/17 0946  BP: 122/78  Weight: 158 lb (71.7 kg)  Height: 5\' 3"  (1.6 m)   Body mass index is 27.99 kg/m.   General appearance:  Normal Thyroid:  Symmetrical, normal in size, without palpable masses or nodularity. Respiratory  Auscultation:  Clear without wheezing or rhonchi Cardiovascular  Auscultation:  Regular rate, without rubs, murmurs or gallops  Edema/varicosities:  Not grossly evident Abdominal  Soft,nontender, without masses, guarding or rebound.  Liver/spleen:  No organomegaly noted  Hernia:  None appreciated  Skin  Inspection:  Grossly normal   Breasts: Examined lying and sitting.     Right: Without masses, retractions, discharge or axillary adenopathy.     Left: Without masses, retractions, discharge or axillary adenopathy. Gentitourinary   Inguinal/mons:  Normal without inguinal adenopathy  External genitalia:  Normal  BUS/Urethra/Skene's glands:  Normal  Vagina:  Normal wet prep negative  Cervix:  Normal  Uterus:   normal in size, shape and contour.  Midline and mobile  Adnexa/parametria:     Rt: Without masses or tenderness.   Lt: Without masses or tenderness.  Anus and perineum: Normal Will's start with UA: Trace leukocytes, 6-10 WBCs, many bacteria, 10-20 squamous epithelials  Assessment/Plan:  21 y.o. SHF G0  for annual exam with complaint of  questionable UTI or vaginal infection.     Monthly cycle on Loestrin Dyspareunia  Plan: Options reviewed, will try over-the-counter lubricants and cycling every 3 months on Loestrin. Loestrin 1/20 prescription, proper use, slight risk for blood clots and strokes reviewed. Reviewed if bleeding prior to 9 weeks, stopping OCs in have cycle. SBE's, exercise, calcium rich diet, MVI daily encouraged. Reviewed importance of decreasing carbs and diet. CBC, Pap normal 2017, new screening guidelines reviewed. Urine culture pending,   Harrington Challengerancy J Ledarrius Beauchaine [facility] HospitalWHNP, 11:44 AM 02/04/2017

## 2017-02-06 LAB — URINALYSIS W MICROSCOPIC + REFLEX CULTURE
BILIRUBIN URINE: NEGATIVE
Glucose, UA: NEGATIVE
HYALINE CAST: NONE SEEN /LPF
KETONES UR: NEGATIVE
Nitrites, Initial: NEGATIVE
PROTEIN: NEGATIVE
Specific Gravity, Urine: 1.02 (ref 1.001–1.03)
pH: 5.5 (ref 5.0–8.0)

## 2017-02-06 LAB — URINE CULTURE
MICRO NUMBER:: 81326527
SPECIMEN QUALITY:: ADEQUATE

## 2017-02-06 LAB — CULTURE INDICATED

## 2018-02-05 ENCOUNTER — Encounter: Payer: Managed Care, Other (non HMO) | Admitting: Women's Health

## 2018-02-18 ENCOUNTER — Other Ambulatory Visit: Payer: Self-pay | Admitting: Women's Health

## 2018-02-18 DIAGNOSIS — Z3041 Encounter for surveillance of contraceptive pills: Secondary | ICD-10-CM

## 2018-03-11 ENCOUNTER — Ambulatory Visit (INDEPENDENT_AMBULATORY_CARE_PROVIDER_SITE_OTHER): Payer: Managed Care, Other (non HMO) | Admitting: Women's Health

## 2018-03-11 ENCOUNTER — Encounter: Payer: Self-pay | Admitting: Women's Health

## 2018-03-11 VITALS — BP 120/82 | Ht 63.0 in | Wt 161.0 lb

## 2018-03-11 DIAGNOSIS — Z3041 Encounter for surveillance of contraceptive pills: Secondary | ICD-10-CM | POA: Diagnosis not present

## 2018-03-11 DIAGNOSIS — Z01419 Encounter for gynecological examination (general) (routine) without abnormal findings: Secondary | ICD-10-CM

## 2018-03-11 LAB — CBC WITH DIFFERENTIAL/PLATELET
ABSOLUTE MONOCYTES: 703 {cells}/uL (ref 200–950)
BASOS ABS: 45 {cells}/uL (ref 0–200)
Basophils Relative: 0.5 %
EOS ABS: 303 {cells}/uL (ref 15–500)
Eosinophils Relative: 3.4 %
HCT: 37.5 % (ref 35.0–45.0)
Hemoglobin: 12.6 g/dL (ref 11.7–15.5)
Lymphs Abs: 2839 cells/uL (ref 850–3900)
MCH: 28.4 pg (ref 27.0–33.0)
MCHC: 33.6 g/dL (ref 32.0–36.0)
MCV: 84.5 fL (ref 80.0–100.0)
MONOS PCT: 7.9 %
MPV: 11.4 fL (ref 7.5–12.5)
NEUTROS PCT: 56.3 %
Neutro Abs: 5011 cells/uL (ref 1500–7800)
PLATELETS: 251 10*3/uL (ref 140–400)
RBC: 4.44 10*6/uL (ref 3.80–5.10)
RDW: 12.4 % (ref 11.0–15.0)
TOTAL LYMPHOCYTE: 31.9 %
WBC: 8.9 10*3/uL (ref 3.8–10.8)

## 2018-03-11 MED ORDER — LEVONORGESTREL-ETHINYL ESTRAD 0.1-20 MG-MCG PO TABS: ORAL_TABLET | ORAL | 4 refills | Status: DC

## 2018-03-11 NOTE — Patient Instructions (Signed)
Diet for Irritable Bowel Syndrome When you have irritable bowel syndrome (IBS), it is very important to eat the foods and follow the eating habits that are best for your condition. IBS may cause various symptoms such as pain in the abdomen, constipation, or diarrhea. Choosing the right foods can help to ease the discomfort from these symptoms. Work with your health care provider and diet and nutrition specialist (dietitian) to find the eating plan that will help to control your symptoms. What are tips for following this plan?      Keep a food diary. This will help you identify foods that cause symptoms. Write down: ? What you eat and when you eat it. ? What symptoms you have. ? When symptoms occur in relation to your meals, such as "pain in abdomen 2 hours after dinner."  Eat your meals slowly and in a relaxed setting.  Aim to eat 5-6 small meals per day. Do not skip meals.  Drink enough fluid to keep your urine pale yellow.  Ask your health care provider if you should take an over-the-counter probiotic to help restore healthy bacteria in your gut (digestive tract). ? Probiotics are foods that contain good bacteria and yeasts.  Your dietitian may have specific dietary recommendations for you based on your symptoms. He or she may recommend that you: ? Avoid foods that cause symptoms. Talk with your dietitian about other ways to get the same nutrients that are in those problem foods. ? Avoid foods with gluten. Gluten is a protein that is found in rye, wheat, and barley. ? Eat more foods that contain soluble fiber. Examples of foods with high soluble fiber include oats, seeds, and certain fruits and vegetables. Take a fiber supplement if directed by your dietitian. ? Reduce or avoid certain foods called FODMAPs. These are foods that contain carbohydrates that are hard to digest. Ask your doctor which foods contain these carbohydrates. What foods are not recommended? The following are some  foods and drinks that may make your symptoms worse:  Fatty foods, such as french fries.  Foods that contain gluten, such as pasta and cereal.  Dairy products, such as milk, cheese, and ice cream.  Chocolate.  Alcohol.  Products with caffeine, such as coffee.  Carbonated drinks, such as soda.  Foods that are high in FODMAPs. These include certain fruits and vegetables.  Products with sweeteners such as honey, high fructose corn syrup, sorbitol, and mannitol. The items listed above may not be a complete list of foods and beverages you should avoid. Contact a dietitian for more information. What foods are good sources of fiber? Your health care provider or dietitian may recommend that you eat more foods that contain fiber. Fiber can help to reduce constipation and other IBS symptoms. Add foods with fiber to your diet a little at a time so your body can get used to them. Too much fiber at one time might cause gas and swelling of your abdomen. The following are some foods that are good sources of fiber:  Berries, such as raspberries, strawberries, and blueberries.  Tomatoes.  Carrots.  Brown rice.  Oats.  Seeds, such as chia and pumpkin seeds. The items listed above may not be a complete list of recommended sources of fiber. Contact your dietitian for more options. Where to find more information  International Foundation for Functional Gastrointestinal Disorders: www.iffgd.org  National Institute of Diabetes and Digestive and Kidney Diseases: www.niddk.nih.gov Summary  When you have irritable bowel syndrome (IBS), it is   very important to eat the foods and follow the eating habits that are best for your condition.  IBS may cause various symptoms such as pain in the abdomen, constipation, or diarrhea.  Choosing the right foods can help to ease the discomfort that comes from symptoms.  Keep a food diary. This will help you identify foods that cause symptoms.  Your health  care provider or diet and nutrition specialist (dietitian) may recommend that you eat more foods that contain fiber. This information is not intended to replace advice given to you by your health care provider. Make sure you discuss any questions you have with your health care provider. Document Released: 05/19/2003 Document Revised: 09/23/2017 Document Reviewed: 10/30/2016 Elsevier Interactive Patient Education  2019 Elsevier Inc. Health Maintenance, Female Adopting a healthy lifestyle and getting preventive care can go a long way to promote health and wellness. Talk with your health care provider about what schedule of regular examinations is right for you. This is a good chance for you to check in with your provider about disease prevention and staying healthy. In between checkups, there are plenty of things you can do on your own. Experts have done a lot of research about which lifestyle changes and preventive measures are most likely to keep you healthy. Ask your health care provider for more information. Weight and diet Eat a healthy diet  Be sure to include plenty of vegetables, fruits, low-fat dairy products, and lean protein.  Do not eat a lot of foods high in solid fats, added sugars, or salt.  Get regular exercise. This is one of the most important things you can do for your health. ? Most adults should exercise for at least 150 minutes each week. The exercise should increase your heart rate and make you sweat (moderate-intensity exercise). ? Most adults should also do strengthening exercises at least twice a week. This is in addition to the moderate-intensity exercise. Maintain a healthy weight  Body mass index (BMI) is a measurement that can be used to identify possible weight problems. It estimates body fat based on height and weight. Your health care provider can help determine your BMI and help you achieve or maintain a healthy weight.  For females 20 years of age and  older: ? A BMI below 18.5 is considered underweight. ? A BMI of 18.5 to 24.9 is normal. ? A BMI of 25 to 29.9 is considered overweight. ? A BMI of 30 and above is considered obese. Watch levels of cholesterol and blood lipids  You should start having your blood tested for lipids and cholesterol at 23 years of age, then have this test every 5 years.  You may need to have your cholesterol levels checked more often if: ? Your lipid or cholesterol levels are high. ? You are older than 23 years of age. ? You are at high risk for heart disease. Cancer screening Lung Cancer  Lung cancer screening is recommended for adults 55-80 years old who are at high risk for lung cancer because of a history of smoking.  A yearly low-dose CT scan of the lungs is recommended for people who: ? Currently smoke. ? Have quit within the past 15 years. ? Have at least a 30-pack-year history of smoking. A pack year is smoking an average of one pack of cigarettes a day for 1 year.  Yearly screening should continue until it has been 15 years since you quit.  Yearly screening should stop if you develop a health problem   that would prevent you from having lung cancer treatment. Breast Cancer  Practice breast self-awareness. This means understanding how your breasts normally appear and feel.  It also means doing regular breast self-exams. Let your health care provider know about any changes, no matter how small.  If you are in your 20s or 30s, you should have a clinical breast exam (CBE) by a health care provider every 1-3 years as part of a regular health exam.  If you are 40 or older, have a CBE every year. Also consider having a breast X-ray (mammogram) every year.  If you have a family history of breast cancer, talk to your health care provider about genetic screening.  If you are at high risk for breast cancer, talk to your health care provider about having an MRI and a mammogram every year.  Breast cancer  gene (BRCA) assessment is recommended for women who have family members with BRCA-related cancers. BRCA-related cancers include: ? Breast. ? Ovarian. ? Tubal. ? Peritoneal cancers.  Results of the assessment will determine the need for genetic counseling and BRCA1 and BRCA2 testing. Cervical Cancer Your health care provider may recommend that you be screened regularly for cancer of the pelvic organs (ovaries, uterus, and vagina). This screening involves a pelvic examination, including checking for microscopic changes to the surface of your cervix (Pap test). You may be encouraged to have this screening done every 3 years, beginning at age 21.  For women ages 30-65, health care providers may recommend pelvic exams and Pap testing every 3 years, or they may recommend the Pap and pelvic exam, combined with testing for human papilloma virus (HPV), every 5 years. Some types of HPV increase your risk of cervical cancer. Testing for HPV may also be done on women of any age with unclear Pap test results.  Other health care providers may not recommend any screening for nonpregnant women who are considered low risk for pelvic cancer and who do not have symptoms. Ask your health care provider if a screening pelvic exam is right for you.  If you have had past treatment for cervical cancer or a condition that could lead to cancer, you need Pap tests and screening for cancer for at least 20 years after your treatment. If Pap tests have been discontinued, your risk factors (such as having a new sexual partner) need to be reassessed to determine if screening should resume. Some women have medical problems that increase the chance of getting cervical cancer. In these cases, your health care provider may recommend more frequent screening and Pap tests. Colorectal Cancer  This type of cancer can be detected and often prevented.  Routine colorectal cancer screening usually begins at 23 years of age and continues  through 23 years of age.  Your health care provider may recommend screening at an earlier age if you have risk factors for colon cancer.  Your health care provider may also recommend using home test kits to check for hidden blood in the stool.  A small camera at the end of a tube can be used to examine your colon directly (sigmoidoscopy or colonoscopy). This is done to check for the earliest forms of colorectal cancer.  Routine screening usually begins at age 50.  Direct examination of the colon should be repeated every 5-10 years through 23 years of age. However, you may need to be screened more often if early forms of precancerous polyps or small growths are found. Skin Cancer  Check your skin from   head to toe regularly.  Tell your health care provider about any new moles or changes in moles, especially if there is a change in a mole's shape or color.  Also tell your health care provider if you have a mole that is larger than the size of a pencil eraser.  Always use sunscreen. Apply sunscreen liberally and repeatedly throughout the day.  Protect yourself by wearing long sleeves, pants, a wide-brimmed hat, and sunglasses whenever you are outside. Heart disease, diabetes, and high blood pressure  High blood pressure causes heart disease and increases the risk of stroke. High blood pressure is more likely to develop in: ? People who have blood pressure in the high end of the normal range (130-139/85-89 mm Hg). ? People who are overweight or obese. ? People who are African American.  If you are 18-39 years of age, have your blood pressure checked every 3-5 years. If you are 40 years of age or older, have your blood pressure checked every year. You should have your blood pressure measured twice-once when you are at a hospital or clinic, and once when you are not at a hospital or clinic. Record the average of the two measurements. To check your blood pressure when you are not at a hospital  or clinic, you can use: ? An automated blood pressure machine at a pharmacy. ? A home blood pressure monitor.  If you are between 55 years and 79 years old, ask your health care provider if you should take aspirin to prevent strokes.  Have regular diabetes screenings. This involves taking a blood sample to check your fasting blood sugar level. ? If you are at a normal weight and have a low risk for diabetes, have this test once every three years after 23 years of age. ? If you are overweight and have a high risk for diabetes, consider being tested at a younger age or more often. Preventing infection Hepatitis B  If you have a higher risk for hepatitis B, you should be screened for this virus. You are considered at high risk for hepatitis B if: ? You were born in a country where hepatitis B is common. Ask your health care provider which countries are considered high risk. ? Your parents were born in a high-risk country, and you have not been immunized against hepatitis B (hepatitis B vaccine). ? You have HIV or AIDS. ? You use needles to inject street drugs. ? You live with someone who has hepatitis B. ? You have had sex with someone who has hepatitis B. ? You get hemodialysis treatment. ? You take certain medicines for conditions, including cancer, organ transplantation, and autoimmune conditions. Hepatitis C  Blood testing is recommended for: ? Everyone born from 1945 through 1965. ? Anyone with known risk factors for hepatitis C. Sexually transmitted infections (STIs)  You should be screened for sexually transmitted infections (STIs) including gonorrhea and chlamydia if: ? You are sexually active and are younger than 24 years of age. ? You are older than 24 years of age and your health care provider tells you that you are at risk for this type of infection. ? Your sexual activity has changed since you were last screened and you are at an increased risk for chlamydia or gonorrhea. Ask  your health care provider if you are at risk.  If you do not have HIV, but are at risk, it may be recommended that you take a prescription medicine daily to prevent HIV infection. This   is called pre-exposure prophylaxis (PrEP). You are considered at risk if: ? You are sexually active and do not regularly use condoms or know the HIV status of your partner(s). ? You take drugs by injection. ? You are sexually active with a partner who has HIV. Talk with your health care provider about whether you are at high risk of being infected with HIV. If you choose to begin PrEP, you should first be tested for HIV. You should then be tested every 3 months for as long as you are taking PrEP. Pregnancy  If you are premenopausal and you may become pregnant, ask your health care provider about preconception counseling.  If you may become pregnant, take 400 to 800 micrograms (mcg) of folic acid every day.  If you want to prevent pregnancy, talk to your health care provider about birth control (contraception). Osteoporosis and menopause  Osteoporosis is a disease in which the bones lose minerals and strength with aging. This can result in serious bone fractures. Your risk for osteoporosis can be identified using a bone density scan.  If you are 65 years of age or older, or if you are at risk for osteoporosis and fractures, ask your health care provider if you should be screened.  Ask your health care provider whether you should take a calcium or vitamin D supplement to lower your risk for osteoporosis.  Menopause may have certain physical symptoms and risks.  Hormone replacement therapy may reduce some of these symptoms and risks. Talk to your health care provider about whether hormone replacement therapy is right for you. Follow these instructions at home:  Schedule regular health, dental, and eye exams.  Stay current with your immunizations.  Do not use any tobacco products including cigarettes,  chewing tobacco, or electronic cigarettes.  If you are pregnant, do not drink alcohol.  If you are breastfeeding, limit how much and how often you drink alcohol.  Limit alcohol intake to no more than 1 drink per day for nonpregnant women. One drink equals 12 ounces of beer, 5 ounces of wine, or 1 ounces of hard liquor.  Do not use street drugs.  Do not share needles.  Ask your health care provider for help if you need support or information about quitting drugs.  Tell your health care provider if you often feel depressed.  Tell your health care provider if you have ever been abused or do not feel safe at home. This information is not intended to replace advice given to you by your health care provider. Make sure you discuss any questions you have with your health care provider. Document Released: 09/11/2010 Document Revised: 08/04/2015 Document Reviewed: 11/30/2014 Elsevier Interactive Patient Education  2019 Elsevier Inc.  

## 2018-03-11 NOTE — Progress Notes (Signed)
Marcelino DusterMichelle [facility] 10/24/1995 [id number]    History:    Presents for annual exam. Monthly cycle on Loestrin without complaint.  Same partner denies need for STD screen.  Normal Pap history.  Gardasil series completed.  Past medical history, past surgical history, family history and social history were all reviewed and documented in the EPIC chart.  ICU nurse at [employer], contemplating nurse practitioner school.  Parents healthy.  ROS:  A ROS was performed and pertinent positives and negatives are included.  Exam:  Vitals:   03/11/18 1209  BP: 120/82  Weight: 161 lb (73 kg)  Height: 5\' 3"  (1.6 m)   Body mass index is 28.52 kg/m.   General appearance:  Normal Thyroid:  Symmetrical, normal in size, without palpable masses or nodularity. Respiratory  Auscultation:  Clear without wheezing or rhonchi Cardiovascular  Auscultation:  Regular rate, without rubs, murmurs or gallops  Edema/varicosities:  Not grossly evident Abdominal  Soft,nontender, without masses, guarding or rebound.  Liver/spleen:  No organomegaly noted  Hernia:  None appreciated  Skin  Inspection:  Grossly normal   Breasts: Examined lying and sitting.     Right: Without masses, retractions, discharge or axillary adenopathy.     Left: Without masses, retractions, discharge or axillary adenopathy. Gentitourinary   Inguinal/mons:  Normal without inguinal adenopathy  External genitalia:  Normal  BUS/Urethra/Skene's glands:  Normal  Vagina:  Normal  Cervix:  Normal  Uterus:   normal in size, shape and contour.  Midline and mobile  Adnexa/parametria:     Rt: Without masses or tenderness.   Lt: Without masses or tenderness.  Anus and perineum: Normal  Digital rectal exam: Normal sphincter tone without palpated masses or tenderness  Assessment/Plan:  22 y.o. SHF G0 for annual exam with IBS type symptoms  Probable IBS Light monthly cycle on Loestrin  Plan: IBS reviewed, more problems with constipation,  occasional loose stools, will try half dose MiraLAX daily if continued problems follow-up with GI.  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  Loestrin 1/20 prescription, proper use, slight risk for blood clots and strokes reviewed.  SBEs, exercise, calcium rich foods, MVI daily encouraged.  CBC, Pap normal 2017, new screening guidelines reviewed.    Harrington Challengerancy J Katherine Tout [facility] HospitalWHNP, 1:09 PM 03/11/2018

## 2018-08-13 ENCOUNTER — Other Ambulatory Visit: Payer: Self-pay

## 2018-08-14 ENCOUNTER — Encounter: Payer: Self-pay | Admitting: Gynecology

## 2018-08-14 ENCOUNTER — Ambulatory Visit: Payer: Managed Care, Other (non HMO) | Admitting: Gynecology

## 2018-08-14 ENCOUNTER — Other Ambulatory Visit: Payer: Self-pay

## 2018-08-14 VITALS — BP 114/74

## 2018-08-14 DIAGNOSIS — B373 Candidiasis of vulva and vagina: Secondary | ICD-10-CM | POA: Diagnosis not present

## 2018-08-14 DIAGNOSIS — R350 Frequency of micturition: Secondary | ICD-10-CM | POA: Diagnosis not present

## 2018-08-14 DIAGNOSIS — B3731 Acute candidiasis of vulva and vagina: Secondary | ICD-10-CM

## 2018-08-14 MED ORDER — FLUCONAZOLE 150 MG PO TABS: 150.0000 mg | ORAL_TABLET | Freq: Every day | ORAL | 0 refills | Status: DC

## 2018-08-14 NOTE — Addendum Note (Signed)
Addended by: Dayna Barker on: 08/14/2018 02:32 PM   Modules accepted: Orders

## 2018-08-14 NOTE — Patient Instructions (Signed)
Take the Diflucan pill now and repeat it in several days.  Follow-up if your symptoms persist

## 2018-08-14 NOTE — Progress Notes (Signed)
    Tonya Huynh [birth year] [id number]        23 y.o.  G0P0000 presents with several days of vaginal itching, urinary frequency and urgency.  No dysuria, low back pain, fever or chills.  No vaginal discharge or vaginal odor.  Past medical history,surgical history, problem list, medications, allergies, family history and social history were all reviewed and documented in the EPIC chart.  Directed ROS with pertinent positives and negatives documented in the history of present illness/assessment and plan.  Exam: Kennon Portela assistant Vitals:   08/14/18 1404  BP: 114/74   General appearance:  Normal Spine straight without CVA tenderness Abdomen soft nontender without masses guarding rebound Pelvic external BUS vagina with light white discharge.  Cervix normal.  Uterus normal size midline mobile nontender.  Adnexa without masses or tenderness.  Assessment/Plan:  23 y.o. G0P0000 with history and exam as above.  Wet prep is positive for yeast.  Urine analysis is totally negative.  I suspect she has distal urethritis from the yeast causing her symptoms.  Will treat with Diflucan 150 mg daily x2 doses.  Follow-up if symptoms persist, worsen or recur.    [name] MD, 2:17 PM 08/14/2018

## 2018-08-15 LAB — WET PREP FOR TRICH, YEAST, CLUE

## 2018-08-16 LAB — URINALYSIS, COMPLETE W/RFL CULTURE
Bacteria, UA: NONE SEEN /HPF
Bilirubin Urine: NEGATIVE
Glucose, UA: NEGATIVE
Hgb urine dipstick: NEGATIVE
Hyaline Cast: NONE SEEN /LPF
Ketones, ur: NEGATIVE
Leukocyte Esterase: NEGATIVE
Nitrites, Initial: NEGATIVE
Protein, ur: NEGATIVE
RBC / HPF: NONE SEEN /HPF (ref 0–2)
Specific Gravity, Urine: 1.025 (ref 1.001–1.03)
WBC, UA: NONE SEEN /HPF (ref 0–5)
pH: 6 (ref 5.0–8.0)

## 2018-08-16 LAB — NO CULTURE INDICATED

## 2018-12-01 ENCOUNTER — Encounter: Payer: Self-pay | Admitting: Gynecology

## 2019-03-05 ENCOUNTER — Ambulatory Visit: Payer: Managed Care, Other (non HMO) | Attending: Internal Medicine

## 2019-03-05 DIAGNOSIS — Z20822 Contact with and (suspected) exposure to covid-19: Secondary | ICD-10-CM

## 2019-03-06 LAB — NOVEL CORONAVIRUS, NAA: SARS-CoV-2, NAA: NOT DETECTED

## 2019-03-16 ENCOUNTER — Encounter: Payer: Managed Care, Other (non HMO) | Admitting: Women's Health

## 2019-03-19 ENCOUNTER — Other Ambulatory Visit: Payer: Self-pay | Admitting: Women's Health

## 2019-03-19 DIAGNOSIS — Z3041 Encounter for surveillance of contraceptive pills: Secondary | ICD-10-CM

## 2019-03-23 ENCOUNTER — Other Ambulatory Visit: Payer: Managed Care, Other (non HMO)

## 2019-04-07 ENCOUNTER — Ambulatory Visit: Payer: Managed Care, Other (non HMO) | Attending: Internal Medicine

## 2019-04-07 ENCOUNTER — Other Ambulatory Visit: Payer: Managed Care, Other (non HMO)

## 2019-04-07 DIAGNOSIS — Z20822 Contact with and (suspected) exposure to covid-19: Secondary | ICD-10-CM

## 2019-04-08 ENCOUNTER — Other Ambulatory Visit: Payer: Managed Care, Other (non HMO)

## 2019-04-08 LAB — NOVEL CORONAVIRUS, NAA: SARS-CoV-2, NAA: NOT DETECTED

## 2019-04-17 ENCOUNTER — Other Ambulatory Visit: Payer: Self-pay

## 2019-04-20 ENCOUNTER — Encounter: Payer: Self-pay | Admitting: Women's Health

## 2019-04-20 ENCOUNTER — Other Ambulatory Visit: Payer: Self-pay

## 2019-04-20 ENCOUNTER — Ambulatory Visit (INDEPENDENT_AMBULATORY_CARE_PROVIDER_SITE_OTHER): Payer: Managed Care, Other (non HMO) | Admitting: Women's Health

## 2019-04-20 VITALS — BP 110/78 | Ht 63.0 in | Wt 159.0 lb

## 2019-04-20 DIAGNOSIS — Z01419 Encounter for gynecological examination (general) (routine) without abnormal findings: Secondary | ICD-10-CM | POA: Diagnosis not present

## 2019-04-20 DIAGNOSIS — N926 Irregular menstruation, unspecified: Secondary | ICD-10-CM | POA: Diagnosis not present

## 2019-04-20 LAB — WET PREP FOR TRICH, YEAST, CLUE

## 2019-04-20 MED ORDER — NORGESTREL-ETHINYL ESTRADIOL 0.3-30 MG-MCG PO TABS: 1.0000 | ORAL_TABLET | Freq: Every day | ORAL | 4 refills | Status: DC

## 2019-04-20 NOTE — Patient Instructions (Signed)
Good to see you today MVI daily Maintenance, Female Adopting a healthy lifestyle and getting preventive care are important in promoting health and wellness. Ask your health care provider about:  The right schedule for you to have regular tests and exams.  Things you can do on your own to prevent diseases and keep yourself healthy. What should I know about diet, weight, and exercise? Eat a healthy diet   Eat a diet that includes plenty of vegetables, fruits, low-fat dairy products, and lean protein.  Do not eat a lot of foods that are high in solid fats, added sugars, or sodium. Maintain a healthy weight Body mass index (BMI) is used to identify weight problems. It estimates body fat based on height and weight. Your health care provider can help determine your BMI and help you achieve or maintain a healthy weight. Get regular exercise Get regular exercise. This is one of the most important things you can do for your health. Most adults should:  Exercise for at least 150 minutes each week. The exercise should increase your heart rate and make you sweat (moderate-intensity exercise).  Do strengthening exercises at least twice a week. This is in addition to the moderate-intensity exercise.  Spend less time sitting. Even light physical activity can be beneficial. Watch cholesterol and blood lipids Have your blood tested for lipids and cholesterol at 25 years of age, then have this test every 5 years. Have your cholesterol levels checked more often if:  Your lipid or cholesterol levels are high.  You are older than 25 years of age.  You are at high risk for heart disease. What should I know about cancer screening? Depending on your health history and family history, you may need to have cancer screening at various ages. This may include screening for:  Breast cancer.  Cervical cancer.  Colorectal cancer.  Skin cancer.  Lung cancer. What should I know about heart disease,  diabetes, and high blood pressure? Blood pressure and heart disease  High blood pressure causes heart disease and increases the risk of stroke. This is more likely to develop in people who have high blood pressure readings, are of African descent, or are overweight.  Have your blood pressure checked: ? Every 3-5 years if you are 2-83 years of age. ? Every year if you are 12 years old or older. Diabetes Have regular diabetes screenings. This checks your fasting blood sugar level. Have the screening done:  Once every three years after age 30 if you are at a normal weight and have a low risk for diabetes.  More often and at a younger age if you are overweight or have a high risk for diabetes. What should I know about preventing infection? Hepatitis B If you have a higher risk for hepatitis B, you should be screened for this virus. Talk with your health care provider to find out if you are at risk for hepatitis B infection. Hepatitis C Testing is recommended for:  Everyone born from [birth year] through 1965.  Anyone with known risk factors for hepatitis C. Sexually transmitted infections (STIs)  Get screened for STIs, including gonorrhea and chlamydia, if: ? You are sexually active and are younger than 25 years of age. ? You are older than 25 years of age and your health care provider tells you that you are at risk for this type of infection. ? Your sexual activity has changed since you were last screened, and you are at increased risk for chlamydia or gonorrhea.  Ask your health care provider if you are at risk.  Ask your health care provider about whether you are at high risk for HIV. Your health care provider may recommend a prescription medicine to help prevent HIV infection. If you choose to take medicine to prevent HIV, you should first get tested for HIV. You should then be tested every 3 months for as long as you are taking the medicine. Pregnancy  If you are about to stop having your  period (premenopausal) and you may become pregnant, seek counseling before you get pregnant.  Take 400 to 800 micrograms (mcg) of folic acid every day if you become pregnant.  Ask for birth control (contraception) if you want to prevent pregnancy. Osteoporosis and menopause Osteoporosis is a disease in which the bones lose minerals and strength with aging. This can result in bone fractures. If you are 22 years old or older, or if you are at risk for osteoporosis and fractures, ask your health care provider if you should:  Be screened for bone loss.  Take a calcium or vitamin D supplement to lower your risk of fractures.  Be given hormone replacement therapy (HRT) to treat symptoms of menopause. Follow these instructions at home: Lifestyle  Do not use any products that contain nicotine or tobacco, such as cigarettes, e-cigarettes, and chewing tobacco. If you need help quitting, ask your health care provider.  Do not use street drugs.  Do not share needles.  Ask your health care provider for help if you need support or information about quitting drugs. Alcohol use  Do not drink alcohol if: ? Your health care provider tells you not to drink. ? You are pregnant, may be pregnant, or are planning to become pregnant.  If you drink alcohol: ? Limit how much you use to 0-1 drink a day. ? Limit intake if you are breastfeeding.  Be aware of how much alcohol is in your drink. In the [place] one drink equals one 12 oz bottle of beer (355 mL), one 5 oz glass of wine (148 mL), or one 1 oz glass of hard liquor (44 mL). General instructions  Schedule regular health, dental, and eye exams.  Stay current with your vaccines.  Tell your health care provider if: ? You often feel depressed. ? You have ever been abused or do not feel safe at home. Summary  Adopting a healthy lifestyle and getting preventive care are important in promoting health and wellness.  Follow your health care provider's  instructions about healthy diet, exercising, and getting tested or screened for diseases.  Follow your health care provider's instructions on monitoring your cholesterol and blood pressure. This information is not intended to replace advice given to you by your health care provider. Make sure you discuss any questions you have with your health care provider. Document Revised: 02/19/2018 Document Reviewed: 02/19/2018 Elsevier Patient Education  2020 [employer].

## 2019-04-20 NOTE — Progress Notes (Signed)
[facility] 24-Oct-1995 [id number]    History:    Presents for annual exam.  Monthly cycle on Mircette until this month.  States has had some irregular bleeding/spotting.  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  Denies any missed or late pills.  Same partner with negative STD screen/ live together.  Has had Gardasil.  Past medical history, past surgical history, family history and social history were all reviewed and documented in the EPIC chart.  Parents healthy, 2 sisters healthy.  ROS:  A ROS was performed and pertinent positives and negatives are included.  Exam:  Vitals:   04/20/19 1536  BP: 110/78  Weight: 159 lb (72.1 kg)  Height: 5\' 3"  (1.6 m)   Body mass index is 28.17 kg/m.   General appearance:  Normal Thyroid:  Symmetrical, normal in size, without palpable masses or nodularity. Respiratory  Auscultation:  Clear without wheezing or rhonchi Cardiovascular  Auscultation:  Regular rate, without rubs, murmurs or gallops  Edema/varicosities:  Not grossly evident Abdominal  Soft,nontender, without masses, guarding or rebound.  Liver/spleen:  No organomegaly noted  Hernia:  None appreciated  Skin  Inspection:  Grossly normal   Breasts: Examined lying and sitting.     Right: Without masses, retractions, discharge or axillary adenopathy.     Left: Without masses, retractions, discharge or axillary adenopathy. Gentitourinary   Inguinal/mons:  Normal without inguinal adenopathy  External genitalia:  Normal  BUS/Urethra/Skene's glands:  Normal  Vagina:  Normal  Cervix:  Normal  Uterus:  normal in size, shape and contour.  Midline and mobile  Adnexa/parametria:     Rt: Without masses or tenderness.   Lt: Without masses or tenderness.  Anus and perineum: Normal    Assessment/Plan:  24 y.o. SHF G0 for annual exam with no complaints of vaginal discharge, urinary symptoms, abdominal pain or fever.     Irregular bleeding/spotting past month with no missed pills.  Plan: Options reviewed, will try different pill lo/ovral prescription, proper use, finish out current pack and start.  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  Instructed to call if continued spotting, reviewed wet prep negative, exam normal.  SBEs, continue regular exercise and low-carb diet.  MVI daily encouraged.  CBC, TSH, Pap.  New screening guidelines reviewed.   22 [facility], 3:48 PM 04/20/2019

## 2019-04-21 LAB — PAP IG W/ RFLX HPV ASCU

## 2019-04-21 LAB — CBC WITH DIFFERENTIAL/PLATELET
Absolute Monocytes: 774 cells/uL (ref 200–950)
Basophils Absolute: 27 cells/uL (ref 0–200)
Basophils Relative: 0.3 %
Eosinophils Absolute: 306 cells/uL (ref 15–500)
Eosinophils Relative: 3.4 %
HCT: 38.4 % (ref 35.0–45.0)
Hemoglobin: 12.9 g/dL (ref 11.7–15.5)
Lymphs Abs: 3681 cells/uL (ref 850–3900)
MCH: 28 pg (ref 27.0–33.0)
MCHC: 33.6 g/dL (ref 32.0–36.0)
MCV: 83.3 fL (ref 80.0–100.0)
MPV: 12.1 fL (ref 7.5–12.5)
Monocytes Relative: 8.6 %
Neutro Abs: 4212 cells/uL (ref 1500–7800)
Neutrophils Relative %: 46.8 %
Platelets: 218 10*3/uL (ref 140–400)
RBC: 4.61 10*6/uL (ref 3.80–5.10)
RDW: 12.4 % (ref 11.0–15.0)
Total Lymphocyte: 40.9 %
WBC: 9 10*3/uL (ref 3.8–10.8)

## 2019-04-21 LAB — TSH: TSH: 1.79 mIU/L

## 2019-04-22 LAB — URINALYSIS, COMPLETE W/RFL CULTURE
Bacteria, UA: NONE SEEN /HPF
Bilirubin Urine: NEGATIVE
Glucose, UA: NEGATIVE
Hyaline Cast: NONE SEEN /LPF
Ketones, ur: NEGATIVE
Nitrites, Initial: NEGATIVE
Protein, ur: NEGATIVE
Specific Gravity, Urine: 1.01 (ref 1.001–1.03)
Squamous Epithelial / HPF: NONE SEEN /HPF (ref <=5)
pH: 6.5 (ref 5.0–8.0)

## 2019-04-22 LAB — URINE CULTURE
MICRO NUMBER:: 10131720
Result:: NO GROWTH
SPECIMEN QUALITY:: ADEQUATE

## 2019-04-22 LAB — CULTURE INDICATED

## 2019-07-14 ENCOUNTER — Other Ambulatory Visit: Payer: Self-pay

## 2020-04-20 ENCOUNTER — Ambulatory Visit (INDEPENDENT_AMBULATORY_CARE_PROVIDER_SITE_OTHER): Payer: Managed Care, Other (non HMO) | Admitting: Nurse Practitioner

## 2020-04-20 ENCOUNTER — Other Ambulatory Visit: Payer: Self-pay

## 2020-04-20 ENCOUNTER — Encounter: Payer: Self-pay | Admitting: Nurse Practitioner

## 2020-04-20 VITALS — BP 110/78 | HR 80 | Ht 63.98 in | Wt 150.2 lb

## 2020-04-20 DIAGNOSIS — Z01419 Encounter for gynecological examination (general) (routine) without abnormal findings: Secondary | ICD-10-CM | POA: Diagnosis not present

## 2020-04-20 DIAGNOSIS — Z3041 Encounter for surveillance of contraceptive pills: Secondary | ICD-10-CM | POA: Diagnosis not present

## 2020-04-20 DIAGNOSIS — N921 Excessive and frequent menstruation with irregular cycle: Secondary | ICD-10-CM | POA: Diagnosis not present

## 2020-04-20 MED ORDER — NORETHINDRONE-ETH ESTRADIOL 0.4-35 MG-MCG PO TABS: 1.0000 | ORAL_TABLET | Freq: Every day | ORAL | 3 refills | Status: DC

## 2020-04-20 NOTE — Progress Notes (Signed)
   [facility] [birth year] [id number]   History:  25 y.o. G0 presents for annual exam. Irregular cycles ranging from 3-5 weeks with spotting that occurs most months 3-7 days before menses and last up to a week. She has a history of irregular cycles and has tried a couple different OCPs. She has also tried ortho evra patch. Normal pap history. Has received Gardasil series.   Gynecologic History Patient's last menstrual period was 03/23/2020 (approximate). Period Cycle (Days): 6 Period Duration (Days):  (28-30) Period Pattern: (!) Irregular (Spotting prior to menses for the past year) Menstrual Flow:  (Heavy for first 2 days then light) Menstrual Control: Panty liner,Maxi pad Menstrual Control Change Freq (Hours): 2-3 Dysmenorrhea: (!) Mild Dysmenorrhea Symptoms: Cramping,Diarrhea Contraception/Family planning: OCP (estrogen/progesterone)  Health Maintenance Last Pap: 04/20/2019. Results were: normal  Past medical history, past surgical history, family history and social history were all reviewed and documented in the EPIC chart.  ROS:  A ROS was performed and pertinent positives and negatives are included.  Exam:  Vitals:   04/20/20 0823  BP: 110/78  Pulse: 80  Weight: 150 lb 3.2 oz (68.1 kg)  Height: 5' 3.98" (1.625 m)   Body mass index is 25.8 kg/m.  General appearance:  Normal Thyroid:  Symmetrical, normal in size, without palpable masses or nodularity. Respiratory  Auscultation:  Clear without wheezing or rhonchi Cardiovascular  Auscultation:  Regular rate, without rubs, murmurs or gallops  Edema/varicosities:  Not grossly evident Abdominal  Soft,nontender, without masses, guarding or rebound.  Liver/spleen:  No organomegaly noted  Hernia:  None appreciated  Skin  Inspection:  Grossly normal   Breasts: Examined lying and sitting.   Right: Without masses, retractions, discharge or axillary adenopathy.   Left: Without masses, retractions, discharge or  axillary adenopathy. Gentitourinary   Inguinal/mons:  Normal without inguinal adenopathy  External genitalia:  Normal  BUS/Urethra/Skene's glands:  Normal  Vagina:  Normal  Cervix:  Normal  Uterus:  Normal in size, shape and contour.  Midline and mobile  Adnexa/parametria:     Rt: Without masses or tenderness.   Lt: Without masses or tenderness.  Anus and perineum: Normal  Assessment/Plan:  25 y.o. G0 for annual exam.   Well female exam with routine gynecological exam - Education provided on SBEs, importance of preventative screenings, current guidelines, high calcium diet, regular exercise, and multivitamin daily.   Encounter for surveillance of contraceptive pills - currently taking Lo/Ovral 0.3-30 mcg daily but has been having spotting that occurs 3-7 days before cycle and lasting up to a week at times.   Breakthrough bleeding on birth control pills - Plan: norethindrone-ethinyl estradiol (OVCON-35) 0.4-35 MG-MCG tablet daily. BTB occurs 3-7 days before cycle and lasts for up to a week. Bleeding is light but requires a panty liner. Other options were reviewed, including hormonal methods, both combination (pill, patch, vaginal ring) and progesterone-only (pill, Depo Provera and Nexplanon), and intrauterine devices (Mirena, Media, [place], and [place]). She would like to try a different pill first and if BTB continues she will consider other methods.   Screening for cervical cancer -normal Pap history.  Will repeat at 3-year interval per guidelines.  Follow-up in 1 year for annual.    [name] [facility], 8:34 AM 04/20/2020

## 2020-04-20 NOTE — Patient Instructions (Signed)
Health Maintenance, Female Adopting a healthy lifestyle and getting preventive care are important in promoting health and wellness. Ask your health care provider about:  The right schedule for you to have regular tests and exams.  Things you can do on your own to prevent diseases and keep yourself healthy. What should I know about diet, weight, and exercise? Eat a healthy diet  Eat a diet that includes plenty of vegetables, fruits, low-fat dairy products, and lean protein.  Do not eat a lot of foods that are high in solid fats, added sugars, or sodium.   Maintain a healthy weight Body mass index (BMI) is used to identify weight problems. It estimates body fat based on height and weight. Your health care provider can help determine your BMI and help you achieve or maintain a healthy weight. Get regular exercise Get regular exercise. This is one of the most important things you can do for your health. Most adults should:  Exercise for at least 150 minutes each week. The exercise should increase your heart rate and make you sweat (moderate-intensity exercise).  Do strengthening exercises at least twice a week. This is in addition to the moderate-intensity exercise.  Spend less time sitting. Even light physical activity can be beneficial. Watch cholesterol and blood lipids Have your blood tested for lipids and cholesterol at 26 years of age, then have this test every 5 years. Have your cholesterol levels checked more often if:  Your lipid or cholesterol levels are high.  You are older than 26 years of age.  You are at high risk for heart disease. What should I know about cancer screening? Depending on your health history and family history, you may need to have cancer screening at various ages. This may include screening for:  Breast cancer.  Cervical cancer.  Colorectal cancer.  Skin cancer.  Lung cancer. What should I know about heart disease, diabetes, and high blood  pressure? Blood pressure and heart disease  High blood pressure causes heart disease and increases the risk of stroke. This is more likely to develop in people who have high blood pressure readings, are of African descent, or are overweight.  Have your blood pressure checked: ? Every 3-5 years if you are 18-39 years of age. ? Every year if you are 40 years old or older. Diabetes Have regular diabetes screenings. This checks your fasting blood sugar level. Have the screening done:  Once every three years after age 40 if you are at a normal weight and have a low risk for diabetes.  More often and at a younger age if you are overweight or have a high risk for diabetes. What should I know about preventing infection? Hepatitis B If you have a higher risk for hepatitis B, you should be screened for this virus. Talk with your health care provider to find out if you are at risk for hepatitis B infection. Hepatitis C Testing is recommended for:  Everyone born from 1945 through 1965.  Anyone with known risk factors for hepatitis C. Sexually transmitted infections (STIs)  Get screened for STIs, including gonorrhea and chlamydia, if: ? You are sexually active and are younger than 26 years of age. ? You are older than 26 years of age and your health care provider tells you that you are at risk for this type of infection. ? Your sexual activity has changed since you were last screened, and you are at increased risk for chlamydia or gonorrhea. Ask your health care provider   if you are at risk.  Ask your health care provider about whether you are at high risk for HIV. Your health care provider may recommend a prescription medicine to help prevent HIV infection. If you choose to take medicine to prevent HIV, you should first get tested for HIV. You should then be tested every 3 months for as long as you are taking the medicine. Pregnancy  If you are about to stop having your period (premenopausal) and  you may become pregnant, seek counseling before you get pregnant.  Take 400 to 800 micrograms (mcg) of folic acid every day if you become pregnant.  Ask for birth control (contraception) if you want to prevent pregnancy. Osteoporosis and menopause Osteoporosis is a disease in which the bones lose minerals and strength with aging. This can result in bone fractures. If you are 65 years old or older, or if you are at risk for osteoporosis and fractures, ask your health care provider if you should:  Be screened for bone loss.  Take a calcium or vitamin D supplement to lower your risk of fractures.  Be given hormone replacement therapy (HRT) to treat symptoms of menopause. Follow these instructions at home: Lifestyle  Do not use any products that contain nicotine or tobacco, such as cigarettes, e-cigarettes, and chewing tobacco. If you need help quitting, ask your health care provider.  Do not use street drugs.  Do not share needles.  Ask your health care provider for help if you need support or information about quitting drugs. Alcohol use  Do not drink alcohol if: ? Your health care provider tells you not to drink. ? You are pregnant, may be pregnant, or are planning to become pregnant.  If you drink alcohol: ? Limit how much you use to 0-1 drink a day. ? Limit intake if you are breastfeeding.  Be aware of how much alcohol is in your drink. In the [place] one drink equals one 12 oz bottle of beer (355 mL), one 5 oz glass of wine (148 mL), or one 1 oz glass of hard liquor (44 mL). General instructions  Schedule regular health, dental, and eye exams.  Stay current with your vaccines.  Tell your health care provider if: ? You often feel depressed. ? You have ever been abused or do not feel safe at home. Summary  Adopting a healthy lifestyle and getting preventive care are important in promoting health and wellness.  Follow your health care provider's instructions about healthy  diet, exercising, and getting tested or screened for diseases.  Follow your health care provider's instructions on monitoring your cholesterol and blood pressure. This information is not intended to replace advice given to you by your health care provider. Make sure you discuss any questions you have with your health care provider. Document Revised: 02/19/2018 Document Reviewed: 02/19/2018 Elsevier Patient Education  2021 [employer]  

## 2021-03-27 ENCOUNTER — Other Ambulatory Visit: Payer: Self-pay

## 2021-03-27 DIAGNOSIS — N921 Excessive and frequent menstruation with irregular cycle: Secondary | ICD-10-CM

## 2021-03-27 MED ORDER — NORETHINDRONE-ETH ESTRADIOL 0.4-35 MG-MCG PO TABS: 1.0000 | ORAL_TABLET | Freq: Every day | ORAL | 0 refills | Status: DC

## 2021-03-27 NOTE — Telephone Encounter (Signed)
Last AEX 04/20/20 AEX  scheduled 04/24/21.

## 2021-04-24 ENCOUNTER — Ambulatory Visit: Payer: Managed Care, Other (non HMO) | Admitting: Nurse Practitioner

## 2021-04-26 ENCOUNTER — Other Ambulatory Visit: Payer: Self-pay

## 2021-04-26 ENCOUNTER — Encounter: Payer: Self-pay | Admitting: Nurse Practitioner

## 2021-04-26 ENCOUNTER — Ambulatory Visit (INDEPENDENT_AMBULATORY_CARE_PROVIDER_SITE_OTHER): Payer: Managed Care, Other (non HMO) | Admitting: Nurse Practitioner

## 2021-04-26 VITALS — BP 116/70 | Ht 63.0 in | Wt 149.0 lb

## 2021-04-26 DIAGNOSIS — Z01419 Encounter for gynecological examination (general) (routine) without abnormal findings: Secondary | ICD-10-CM

## 2021-04-26 DIAGNOSIS — Z3041 Encounter for surveillance of contraceptive pills: Secondary | ICD-10-CM | POA: Diagnosis not present

## 2021-04-26 DIAGNOSIS — N921 Excessive and frequent menstruation with irregular cycle: Secondary | ICD-10-CM | POA: Diagnosis not present

## 2021-04-26 MED ORDER — NORETHINDRONE-ETH ESTRADIOL 0.4-35 MG-MCG PO TABS: 1.0000 | ORAL_TABLET | Freq: Every day | ORAL | 0 refills | Status: DC

## 2021-04-26 NOTE — Progress Notes (Signed)
[facility] 06-26-1995 [id number]   History:  26 y.o. G0 presents for annual exam. Irregular cycles ranging from 3-5 weeks with spotting that occurs 1 week before menses and lasts up to a week afterwards. She has a history of irregular cycles and has tried a couple different OCPs. She has also tried ortho evra patch. Last year we increased OCP dose and she has had no change in bleeding pattern. She bleeds 2 weeks out of the month. Mother had hysterectomy d/t AUB that she thinks was caused by fibroids.  Normal pap history. Has received Gardasil series. PCP managing migraines.   Gynecologic History Patient's last menstrual period was 04/12/2021. Period Cycle (Days):  (Varies 10 days early or late) Period Duration (Days): 6 Period Pattern: (!) Irregular Menstrual Flow: Moderate Dysmenorrhea: (!) Moderate Dysmenorrhea Symptoms: Cramping Contraception/Family planning: OCP (estrogen/progesterone) Sexually active: Yes  Health Maintenance Last Pap: 04/20/2019. Results were: Normal, 3-year repeat Last mammogram: Not indicated Last colonoscopy: Not indicated Last Dexa: Not indicated  Past medical history, past surgical history, family history and social history were all reviewed and documented in the EPIC chart. 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   ROS:  A ROS was performed and pertinent positives and negatives are included.  Exam:  Vitals:   04/26/21 1326  BP: 116/70  Weight: 149 lb (67.6 kg)  Height: 5\' 3"  (1.6 m)    Body mass index is 26.39 kg/m.  General appearance:  Normal Thyroid:  Symmetrical, normal in size, without palpable masses or nodularity. Respiratory  Auscultation:  Clear without wheezing or rhonchi Cardiovascular  Auscultation:  Regular rate, without rubs, murmurs or gallops  Edema/varicosities:  Not grossly evident Abdominal  Soft,nontender, without masses, guarding or rebound.  Liver/spleen:  No organomegaly noted  Hernia:   None appreciated  Skin  Inspection:  Grossly normal   Breasts: Examined lying and sitting.   Right: Without masses, retractions, discharge or axillary adenopathy.   Left: Without masses, retractions, discharge or axillary adenopathy. Genitourinary   Inguinal/mons:  Normal without inguinal adenopathy  External genitalia:  Normal appearing vulva with no masses, tenderness, or lesions  BUS/Urethra/Skene's glands:  Normal  Vagina:  Normal appearing with normal color and discharge, no lesions  Cervix:  Normal appearing without discharge or lesions  Uterus:  Normal in size, shape and contour.  Midline and mobile, nontender  Adnexa/parametria:     Rt: Normal in size, without masses or tenderness.   Lt: Normal in size, without masses or tenderness.  Anus and perineum: Normal  Digital rectal exam: Not indicated  Patient informed chaperone available to be present for breast and pelvic exam. Patient has requested no chaperone to be present. Patient has been advised what will be completed during breast and pelvic exam.   Assessment/Plan:  26 y.o. G0 for annual exam.   Well female exam with routine gynecological exam - Plan: CBC with Differential/Platelet. Education provided on SBEs, importance of preventative screenings, current guidelines, high calcium diet, regular exercise, and multivitamin daily.   Encounter for surveillance of contraceptive pills - Plan: norethindrone-ethinyl estradiol (OVCON-35) 0.4-35 MG-MCG tablet daily. Taking as prescribed.   Breakthrough bleeding on birth control pills - Plan: TSH, 08-31-1991 PELVIS TRANSVAGINAL NON-OB (TV ONLY), Prolactin. Irregular cycles ranging from 3-5 weeks with spotting that occurs 1 week before menses and last up to a week afterwards. She has a history of irregular cycles and has tried a couple different OCPs. She has also tried ortho evra patch. Last year we  increased OCP dose and she has had no change in bleeding pattern. She bleeds 2 weeks out of the  month. Mother had hysterectomy d/t AUB that she thinks was caused by fibroids. Will check TSH, Prolactin, and schedule pelvic ultrasound. If normal, we briefly discussed management with IUD or Depo Provera.   Screening for cervical cancer -normal Pap history.  Will repeat at 3-year interval per guidelines.  Follow-up in 1 year for annual.    Tonya Huynh [facility], 1:47 PM 04/26/2021

## 2021-04-27 LAB — CBC WITH DIFFERENTIAL/PLATELET
Absolute Monocytes: 804 cells/uL (ref 200–950)
Basophils Absolute: 49 cells/uL (ref 0–200)
Basophils Relative: 0.5 %
Eosinophils Absolute: 441 cells/uL (ref 15–500)
Eosinophils Relative: 4.5 %
HCT: 37 % (ref 35.0–45.0)
Hemoglobin: 12.1 g/dL (ref 11.7–15.5)
Lymphs Abs: 3832 cells/uL (ref 850–3900)
MCH: 28.1 pg (ref 27.0–33.0)
MCHC: 32.7 g/dL (ref 32.0–36.0)
MCV: 85.8 fL (ref 80.0–100.0)
MPV: 12.3 fL (ref 7.5–12.5)
Monocytes Relative: 8.2 %
Neutro Abs: 4675 cells/uL (ref 1500–7800)
Neutrophils Relative %: 47.7 %
Platelets: 257 10*3/uL (ref 140–400)
RBC: 4.31 10*6/uL (ref 3.80–5.10)
RDW: 12.6 % (ref 11.0–15.0)
Total Lymphocyte: 39.1 %
WBC: 9.8 10*3/uL (ref 3.8–10.8)

## 2021-04-27 LAB — PROLACTIN: Prolactin: 15.2 ng/mL

## 2021-04-27 LAB — TSH: TSH: 0.93 mIU/L

## 2021-05-29 NOTE — Progress Notes (Signed)
GYNECOLOGY  VISIT ?  ?HPI: ?26 y.o.   Single  Hispanic  female   ?G0P0000 with Patient's last menstrual period was 05/08/2021 (approximate).   ?here for pelvic ultrasound.   ? ?Patient has menses every 3 - 5 weeks.  ?Usually last about 4 - 6 days, but then has spotting for 3 - 4 days in between cycles.  ?Has back pain and leg pain with her cycles.  ? ?Taking pills regularly.  ? ?Has used COCs and Ortho Evra. ? ?Normal TSH and prolactin.  ? ?Hx migraines since age 24 - 20 yo.  ?Having migraines more regularly now.  ?Not related to her cycles.  ?Has auditory migraines and occasional visual migraine.  ?Treating with Elavil. ? ?Not planning pregnancy for 2 - 3 years.  ? ?GYNECOLOGIC HISTORY: ?Patient's last menstrual period was 05/08/2021 (approximate). ?Contraception:  OCPs ?Menopausal hormone therapy:  none ?Last mammogram:  n/a ?Last pap smear:   04-20-19 Neg, 01-31-16 Neg ?       ?OB History   ? ? Gravida  ?0  ? Para  ?0  ? Term  ?0  ? Preterm  ?0  ? AB  ?0  ? Living  ?0  ?  ? ? SAB  ?0  ? IAB  ?0  ? Ectopic  ?0  ? Multiple  ?0  ? Live Births  ?0  ?   ?  ?  ?    ? ?Patient Active Problem List  ? Diagnosis Date Noted  ? Headache(784.0) 07/08/2013  ? Chest pain 10/17/2012  ? ? ?Past Medical History:  ?Diagnosis Date  ? Migraines   ? with aura  ? Vertigo   ? ? ?Past Surgical History:  ?Procedure Laterality Date  ? None    ? ? ?Current Outpatient Medications  ?Medication Sig Dispense Refill  ? amitriptyline (ELAVIL) 25 MG tablet Take 25 mg by mouth at bedtime.    ? cetirizine (ZYRTEC) 10 MG tablet Take 10 mg by mouth daily.    ? norethindrone-ethinyl estradiol (OVCON-35) 0.4-35 MG-MCG tablet Take 1 tablet by mouth daily. 84 tablet 0  ? SUMAtriptan (IMITREX) 25 MG tablet Take by mouth.    ? ?No current facility-administered medications for this visit.  ?  ? ?ALLERGIES: Patient has no known allergies. ? ?History reviewed. No pertinent family history. ? ?Social History  ? ?Socioeconomic History  ? Marital status: Single  ?   Spouse name: Not on file  ? Number of children: 0  ? Years of education: college  ? Highest education level: Not on file  ?Occupational History  ? Occupation: [employer]  ?Tobacco Use  ? Smoking status: Never  ? Smokeless tobacco: Never  ?Vaping Use  ? Vaping Use: Never used  ?Substance and Sexual Activity  ? Alcohol use: Yes  ?  Comment: Occas  ? Drug use: No  ? Sexual activity: Yes  ?  Birth control/protection: Pill  ?Other Topics Concern  ? Not on file  ?Social History Narrative  ? Patient is single.  ? Caffeine-None  ? Patient lives at home with parents.   ? Patient is in college.   ? Patient has no children.   ? ?Social Determinants of Health  ? ?Financial Resource Strain: Not on file  ?Food Insecurity: Not on file  ?Transportation Needs: Not on file  ?Physical Activity: Not on file  ?Stress: Not on file  ?Social Connections: Not on file  ?Intimate Partner Violence: Not on file  ? ? ?  Review of Systems  ?All other systems reviewed and are negative. ? ?PHYSICAL EXAMINATION:   ? ?BP 100/64   Pulse 83   Ht 5\' 3"  (1.6 m)   Wt 149 lb (67.6 kg)   LMP 05/08/2021 (Approximate)   SpO2 98%   BMI 26.39 kg/m?     ?General appearance: alert, cooperative and appears stated age ? ?Pelvic: External genitalia:  no lesions ?             Urethra:  normal appearing urethra with no masses, tenderness or lesions ?             Bartholins and Skenes: normal    ?             Vagina: normal appearing vagina with normal color and discharge, no lesions ?             Cervix: no lesions ?               ?Bimanual Exam:  Uterus:  normal size, contour, position, consistency, mobility, non-tender ?             Adnexa: no mass, fullness, tenderness ?        ?Chaperone was present for exam:  yes ? ?Pelvic 05/10/2021  ?Uterus 7.37 x 4.87 x 3.7 cm.  No myometrial masses. ?EMS 3.02 mm.  ?Normal ovaries.  ?No adnexal masses.  ?No free fluid.  ? ?ASSESSMENT ? ?Breakthrough bleeding on COCs.  ?Migraine with aura.  ?STD screening.   ? ?PLAN ? ?We reviewed her pelvic ultrasound images and report.  ?STD screening for GC/CT/trichomonas.  ?We discussed switching to progesterone only contraception due to her history of migraine with aura and increased risk of stroke.  I do recommend she discontinue her combined contraception.  ?POPs versus Mirena IUD discussed.  ?She will consider these options.  ?FU prn.  ? ?An After Visit Summary was printed and given to the patient. ? ?35 min  total time was spent for this patient encounter, including preparation, face-to-face counseling with the patient, coordination of care, and documentation of the encounter. ? ? ?

## 2021-05-30 ENCOUNTER — Other Ambulatory Visit: Payer: Self-pay

## 2021-05-30 ENCOUNTER — Ambulatory Visit: Payer: Managed Care, Other (non HMO)

## 2021-05-30 ENCOUNTER — Other Ambulatory Visit (HOSPITAL_COMMUNITY)
Admission: RE | Admit: 2021-05-30 | Discharge: 2021-05-30 | Disposition: A | Discharge status: Home / Self Care | Payer: Managed Care, Other (non HMO) | Source: Ambulatory Visit | Attending: Obstetrics and Gynecology | Admitting: Obstetrics and Gynecology

## 2021-05-30 ENCOUNTER — Ambulatory Visit (INDEPENDENT_AMBULATORY_CARE_PROVIDER_SITE_OTHER): Payer: Managed Care, Other (non HMO) | Admitting: Obstetrics and Gynecology

## 2021-05-30 ENCOUNTER — Encounter: Payer: Self-pay | Admitting: Obstetrics and Gynecology

## 2021-05-30 VITALS — BP 100/64 | HR 83 | Ht 63.0 in | Wt 149.0 lb

## 2021-05-30 DIAGNOSIS — Z113 Encounter for screening for infections with a predominantly sexual mode of transmission: Secondary | ICD-10-CM | POA: Insufficient documentation

## 2021-05-30 DIAGNOSIS — N921 Excessive and frequent menstruation with irregular cycle: Secondary | ICD-10-CM | POA: Diagnosis not present

## 2021-05-30 DIAGNOSIS — N939 Abnormal uterine and vaginal bleeding, unspecified: Secondary | ICD-10-CM | POA: Diagnosis not present

## 2021-05-31 LAB — CERVICOVAGINAL ANCILLARY ONLY
Chlamydia: NEGATIVE
Comment: NEGATIVE
Comment: NEGATIVE
Comment: NORMAL
Neisseria Gonorrhea: NEGATIVE
Trichomonas: NEGATIVE

## 2021-06-01 NOTE — Patient Instructions (Signed)

## 2021-07-22 ENCOUNTER — Other Ambulatory Visit: Payer: Self-pay | Admitting: Nurse Practitioner

## 2021-07-22 DIAGNOSIS — Z3041 Encounter for surveillance of contraceptive pills: Secondary | ICD-10-CM

## 2021-07-31 ENCOUNTER — Telehealth: Payer: Self-pay

## 2021-07-31 DIAGNOSIS — Z3041 Encounter for surveillance of contraceptive pills: Secondary | ICD-10-CM

## 2021-07-31 MED ORDER — NORETHINDRONE 0.35 MG PO TABS: 1.0000 | ORAL_TABLET | Freq: Every day | ORAL | 2 refills | Status: DC

## 2021-07-31 NOTE — Telephone Encounter (Signed)
Please discontinue Ovcon and start Micronor with the first day of her menses.   Rx:  Micronor, generic ok.  Sid:  Take one pill by mouth daily.   Disp:  3 packs RF:  2  Please let patient know that there are no placebo pills at the end of the pack, so she is literally taking the medication every day of the year.  She needs to be on time with these pills, and has a three hour window during which she needs to take the medication, or it will not provide pregnancy prevention.   She will need to use back up pregnancy prevention for 7 days.

## 2021-07-31 NOTE — Telephone Encounter (Signed)
Pt calling for Rx refill on BCPs. States she just took her last dose of OCPs and is expected to start cycle and was told by pharmacy that Rx was denied. Pt informed that Rx was denied due to providers recommendation to discontinue COCs due to risks w/ Hx of migraines w/ aura and increased risk of stroke at her last appt. Pt voiced understanding states based on the options she was given with POPs vs IUD, she would prefer to start on POPs. She is inquiring if she needs an appt or if she can just start the Rx. Please advise.

## 2021-07-31 NOTE — Telephone Encounter (Signed)
FYI. Left detailed VM per DPR. Rx sent.

## 2021-08-01 NOTE — Telephone Encounter (Signed)
Encounter reviewed and closed.  

## 2021-08-21 ENCOUNTER — Other Ambulatory Visit: Payer: Self-pay | Admitting: Obstetrics and Gynecology

## 2021-08-21 DIAGNOSIS — Z308 Encounter for other contraceptive management: Secondary | ICD-10-CM

## 2021-08-21 NOTE — Progress Notes (Signed)
Order placed for Mirena or Clinton IUD.

## 2021-08-23 NOTE — Progress Notes (Deleted)
GYNECOLOGY  VISIT   HPI: 26 y.o.   Single  Hispanic  female   G0P0000 with No LMP recorded.   here for     GYNECOLOGIC HISTORY: No LMP recorded. Contraception:  OCPs Menopausal hormone therapy:  n/a Last mammogram:  n/a Last pap smear:  04-20-19 Neg, 01-31-16 Neg        OB History     Gravida  0   Para  0   Term  0   Preterm  0   AB  0   Living  0      SAB  0   IAB  0   Ectopic  0   Multiple  0   Live Births  0              Patient Active Problem List   Diagnosis Date Noted   Headache(784.0) 07/08/2013   Chest pain 10/17/2012    Past Medical History:  Diagnosis Date   Migraines    with aura   Vertigo     Past Surgical History:  Procedure Laterality Date   None      Current Outpatient Medications  Medication Sig Dispense Refill   amitriptyline (ELAVIL) 25 MG tablet Take 25 mg by mouth at bedtime.     cetirizine (ZYRTEC) 10 MG tablet Take 10 mg by mouth daily.     norethindrone (MICRONOR) 0.35 MG tablet Take 1 tablet (0.35 mg total) by mouth daily. 63 tablet 2   SUMAtriptan (IMITREX) 25 MG tablet Take by mouth.     No current facility-administered medications for this visit.     ALLERGIES: Patient has no known allergies.  No family history on file.  Social History   Socioeconomic History   Marital status: Single    Spouse name: Not on file   Number of children: 0   Years of education: college   Highest education level: Not on file  Occupational History   Occupation: [employer]  Tobacco Use   Smoking status: Never   Smokeless tobacco: Never  Vaping Use   Vaping Use: Never used  Substance and Sexual Activity   Alcohol use: Yes    Comment: Occas   Drug use: No   Sexual activity: Yes    Birth control/protection: Pill  Other Topics Concern   Not on file  Social History Narrative   Patient is single.   Caffeine-None   Patient lives at home with parents.    Patient is in college.    Patient has no  children.    Social Determinants of Health   Financial Resource Strain: Not on file  Food Insecurity: Not on file  Transportation Needs: Not on file  Physical Activity: Not on file  Stress: Not on file  Social Connections: Not on file  Intimate Partner Violence: Not on file    Review of Systems  PHYSICAL EXAMINATION:    There were no vitals taken for this visit.    General appearance: alert, cooperative and appears stated age Head: Normocephalic, without obvious abnormality, atraumatic Neck: no adenopathy, supple, symmetrical, trachea midline and thyroid normal to inspection and palpation Lungs: clear to auscultation bilaterally Breasts: normal appearance, no masses or tenderness, No nipple retraction or dimpling, No nipple discharge or bleeding, No axillary or supraclavicular adenopathy Heart: regular rate and rhythm Abdomen: soft, non-tender, no masses,  no organomegaly Extremities: extremities normal, atraumatic, no cyanosis or edema Skin: Skin color, texture, turgor normal. No rashes or lesions Lymph nodes: Cervical,  supraclavicular, and axillary nodes normal. No abnormal inguinal nodes palpated Neurologic: Grossly normal  Pelvic: External genitalia:  no lesions              Urethra:  normal appearing urethra with no masses, tenderness or lesions              Bartholins and Skenes: normal                 Vagina: normal appearing vagina with normal color and discharge, no lesions              Cervix: no lesions                Bimanual Exam:  Uterus:  normal size, contour, position, consistency, mobility, non-tender              Adnexa: no mass, fullness, tenderness              Rectal exam: {yes no:[id number]}.  Confirms.              Anus:  normal sphincter tone, no lesions  Chaperone was present for exam:  ***  ASSESSMENT     PLAN     An After Visit Summary was printed and given to the patient.  ______ minutes face to face time of which over 50% was spent in  counseling.

## 2021-08-28 ENCOUNTER — Ambulatory Visit: Payer: Managed Care, Other (non HMO) | Admitting: Obstetrics and Gynecology

## 2021-08-28 DIAGNOSIS — Z0289 Encounter for other administrative examinations: Secondary | ICD-10-CM

## 2021-08-30 ENCOUNTER — Ambulatory Visit: Payer: Managed Care, Other (non HMO) | Admitting: Obstetrics and Gynecology

## 2021-09-22 ENCOUNTER — Ambulatory Visit: Payer: Managed Care, Other (non HMO) | Admitting: Obstetrics and Gynecology

## 2021-09-22 ENCOUNTER — Encounter: Payer: Self-pay | Admitting: Obstetrics and Gynecology

## 2021-09-22 VITALS — BP 118/78 | HR 82 | Ht 63.0 in | Wt 149.0 lb

## 2021-09-22 DIAGNOSIS — Z30430 Encounter for insertion of intrauterine contraceptive device: Secondary | ICD-10-CM | POA: Diagnosis not present

## 2021-09-22 DIAGNOSIS — Z01812 Encounter for preprocedural laboratory examination: Secondary | ICD-10-CM

## 2021-09-22 DIAGNOSIS — Z308 Encounter for other contraceptive management: Secondary | ICD-10-CM

## 2021-09-22 LAB — PREGNANCY, URINE: Preg Test, Ur: NEGATIVE

## 2021-09-22 NOTE — Patient Instructions (Signed)
Intrauterine Device Insertion An intrauterine device (IUD) is a medical device that is inserted into the uterus to prevent pregnancy. It is a small, T-shaped device that has one or two nylon strings hanging down from it. The strings hang out of the lower part of the uterus (cervix) to allow for future IUD removal. There are two types of IUDs: Hormone IUD. This type of IUD is made of plastic and contains the hormone progestin (synthetic progesterone). A hormone IUD may last 3-5 years, depending on which one you have. Synthetic progesterone prevents pregnancy by: Thickening cervical mucus to prevent sperm from entering the uterus. Thinning the uterine lining to prevent a fertilized egg from implanting there. Copper IUD. This type of IUD has copper wire wrapped around it. A copper IUD may last up to 10 years. Copper prevents pregnancy by making the uterus and fallopian tubes produce a fluid that kills sperm. Tell a health care provider about: Any allergies you have. All medicines you are taking, including vitamins, herbs, eye drops, creams, and over-the-counter medicines. Any surgeries you have had. Any medical conditions you have, including any sexually transmitted infections (STIs) you may have. Whether you are pregnant or may be pregnant. What are the risks? Generally, this is a safe procedure. However, problems may occur, including: Infection. Bleeding. Allergic reactions to medicines. Puncture (perforation) of the uterus or damage to other structures or organs. Accidental placement of the IUD either in the muscle layer of the uterus (myometrium) or outside the uterus. The IUD falling out of the uterus (expulsion). This is more common among women who have recently had a child. Higher risk of an egg being fertilized outside your uterus (ectopic pregnancy).This is rare. Pelvic inflammatory disease (PID), which is an infection in the uterus and fallopian tubes. The IUD does not cause the  infection. The infection is usually from an unknown sexually transmitted infection (STI). This is rare, and it usually happens during the first 20 days after the IUD is inserted. What happens before the procedure? Ask your health care provider about: Changing or stopping your regular medicines. This is especially important if you are taking diabetes medicines or blood thinners. Taking over-the-counter medicines, vitamins, herbs, and supplements. Talk with your health care provider about when to schedule your IUD placement. Your health care provider may recommend taking over-the-counter pain medicines before the procedure. These medicines include ibuprofen and naproxen. You may have tests for: Pregnancy. A pregnancy test involves having a urine or blood sample taken. Sexually transmitted infections (STIs). Placing an IUD in someone who has an STI can make the infection worse. Cervical cancer. You may have a Pap test to check for this type of cancer. This means collecting cells from your cervix to be checked under a microscope. You may have a physical exam to determine the size and position of your uterus. What happens during the procedure? A tool (speculum) will be placed in your vagina and widened so that your health care provider can see your cervix. Medicine, or antiseptic, may be applied to your cervix to help lower your risk of infection. You may be given an anesthetic medicine to numb each side of your cervix. This medicine is usually given by an injection into the cervix. A tool called a uterine sound will be inserted into your uterus to check the length of your uterus and the direction that your uterus may be tilted. A slim instrument or tube (IUD inserter) that holds the IUD will be inserted into your vagina,   through your cervical canal, and into your uterus. The IUD will be placed in the uterus, and the IUD inserter will be removed. The strings that are attached to the IUD will be trimmed  so that they lie just below the cervix. The speculum will be removed. The procedure may vary among health care providers and hospitals. What can I expect after procedure? You may have bleeding after the procedure. This is normal. It varies from light bleeding (spotting) for a few days to menstrual-like bleeding. You may have cramping and pain in the abdomen. You may feel dizzy or light-headed. You may have lower back pain. You may have headaches and nausea. Follow these instructions at home: Before resuming sexual activity, check to make sure that you can feel the IUD string or strings. You should be able to feel the end of the string below the opening of your cervix. If your IUD string is in place, you may resume sexual activity. If you had a hormonal IUD inserted more than 7 days after your most recent period started, you will need to use a backup method of birth control for 7 days after IUD insertion. Ask your health care provider whether this applies to you. Continue to check that the IUD is still in place by feeling for the strings after every menstrual period, or once a month. An IUD will not protect you from sexually transmitted infections (STIs). Use methods to prevent the exchange of body fluids between partners (barrier protection) every time you have sex. Barrier protection can be used during oral, vaginal, or anal sex. Commonly used barrier methods include: Female condom. Female condom. Dental dam. Take over-the-counter and prescription medicines only as told by your health care provider. Keep all follow-up visits. This is important. Contact a health care provider if: You feel light-headed or weak. You have any of the following problems with your IUD string or strings: The string bothers or hurts you or your sexual partner. You cannot feel the string. The string has gotten longer. You can feel the IUD in your vagina. You think you may be pregnant, or you miss your menstrual  period. You think you may have a sexually transmitted infection (STI). Get help right away if you: You have flu-like symptoms, such as tiredness (fatigue) and muscle aches. You have a fever and chills. You have bleeding that is heavier or lasts longer than a normal menstrual cycle. You have abnormal or bad-smelling discharge from your vagina. You develop abdominal pain that is new, is getting worse, or is not in the same area of earlier cramping and pain. You have pain during sexual activity. Summary An intrauterine device (IUD) is a small, T-shaped device that has one or two nylon strings hanging down from it. You may have a copper IUD or a hormone IUD. Ask your health care provider what you need to do before the procedure. You may have some tests and you may have to change or stop some medicines. You may have bleeding after the procedure. This is normal. It varies from light spotting for a few days to menstrual-like bleeding. Check to make sure that you can feel the IUD strings before you resume sexual activity. Check the strings after every menstrual period or once a month. An IUD does not protect against STIs. Use other methods to protect yourself against infections. This information is not intended to replace advice given to you by your health care provider. Make sure you discuss any questions you have with   your health care provider. Document Revised: 09/09/2019 Document Reviewed: 09/09/2019 Elsevier Patient Education  2023 [employer] Inc[employer]  

## 2021-09-22 NOTE — Progress Notes (Signed)
GYNECOLOGY  VISIT   HPI: 26 y.o.   Single  Caucasian  female   G0P0000 with Patient's last menstrual period was 09/18/2021.   here for Mirena IUD insertion.   Tried the Micronor and wants more reliable contraception.   UPT negative today.   GYNECOLOGIC HISTORY: Patient's last menstrual period was 09/18/2021. spotting Contraception:  iud Menopausal hormone therapy:  n/a Last mammogram:  n/a Last pap smear:   04/20/2019 NEG        OB History     Gravida  0   Para  0   Term  0   Preterm  0   AB  0   Living  0      SAB  0   IAB  0   Ectopic  0   Multiple  0   Live Births  0              Patient Active Problem List   Diagnosis Date Noted   Headache(784.0) 07/08/2013   Chest pain 10/17/2012    Past Medical History:  Diagnosis Date   Migraines    with aura   Vertigo     Past Surgical History:  Procedure Laterality Date   None      Current Outpatient Medications  Medication Sig Dispense Refill   amitriptyline (ELAVIL) 25 MG tablet Take 25 mg by mouth at bedtime.     cetirizine (ZYRTEC) 10 MG tablet Take 10 mg by mouth daily.     norethindrone (MICRONOR) 0.35 MG tablet Take 1 tablet (0.35 mg total) by mouth daily. 63 tablet 2   SUMAtriptan (IMITREX) 25 MG tablet Take by mouth.     No current facility-administered medications for this visit.     ALLERGIES: Patient has no known allergies.  History reviewed. No pertinent family history.  Social History   Socioeconomic History   Marital status: Single    Spouse name: Not on file   Number of children: 0   Years of education: college   Highest education level: Not on file  Occupational History   Occupation: [employer]ist  Tobacco Use   Smoking status: Never   Smokeless tobacco: Never  Vaping Use   Vaping Use: Never used  Substance and Sexual Activity   Alcohol use: Yes    Comment: Occas   Drug use: No   Sexual activity: Yes    Birth control/protection: Pill  Other  Topics Concern   Not on file  Social History Narrative   Patient is single.   Caffeine-None   Patient lives at home with parents.    Patient is in college.    Patient has no children.    Social Determinants of Health   Financial Resource Strain: Not on file  Food Insecurity: Not on file  Transportation Needs: Not on file  Physical Activity: Not on file  Stress: Not on file  Social Connections: Not on file  Intimate Partner Violence: Not on file    Review of Systems  All other systems reviewed and are negative.   PHYSICAL EXAMINATION:    BP 118/78 (BP Location: Right Arm, Patient Position: Sitting, Cuff Size: Normal)   Pulse 82   Ht 5\' 3"  (1.6 m)   Wt 149 lb (67.6 kg)   LMP 09/18/2021 Comment: spotting  BMI 26.39 kg/m     General appearance: alert, cooperative and appears stated age   Pelvic: External genitalia:  no lesions  Urethra:  normal appearing urethra with no masses, tenderness or lesions              Bartholins and Skenes: normal                 Vagina: normal appearing vagina with normal color and discharge, no lesions              Cervix: no lesions                Bimanual Exam:  Uterus:  normal size, contour, position, consistency, mobility, non-tender              Adnexa: no mass, fullness, tenderness           Mirena IUD insertion. Lot [id number], exp April 2025. Consent performed verbally and in written form.   Risks of Mirena IUD include but are not limited to heavy bleeding, prolonged irregular bleeding, discomfort, infection, ectopic pregnancy, malpositioned IUD, uterine perforation, spontaneous expulsion of the IUD, and surgery to remove the IUD.  Hibiclens prep. Paracervical block with 10 cc 1% lidocaine, lot [id number], exp 03/13/23. Tenaculum to anterior cervical lip.  Os finder used.  Uterus sounded to 7 cm.  Mirena IUD placed without difficulty.  Strings trimmed and shown to patient.  No complications.  Minimal EBL.  Repeat BM exam, no  change.   Chaperone was present for exam:  Kimalexis, CMA  ASSESSMENT  Mirena IUD insertion.    PLAN  Post IUD instructions to patient.  Back up contraception for one week.  IUD card to patient.  IUD check in 4 weeks.    An After Visit Summary was printed and given to the patient.

## 2021-10-23 NOTE — Progress Notes (Signed)
GYNECOLOGY  VISIT   HPI: 26 y.o.   Single  Caucasian  female   G0P0000 with No LMP recorded. (Menstrual status: IUD).   here for 1 month IUD check.  Some spotting off and on since insertion. No issues per patient.   Thinks she may have had a period, but it was really light.  No cramping or discomfort.   Sexually active with the IUD.  No pain or problems.   She did stop the Micronor.   GYNECOLOGIC HISTORY: No LMP recorded. (Menstrual status: IUD). Contraception:  Mirena IUD 09-22-21 Menopausal hormone therapy:  n/a Last mammogram:  n/a Last pap smear:   04/20/2019 Neg        OB History     Gravida  0   Para  0   Term  0   Preterm  0   AB  0   Living  0      SAB  0   IAB  0   Ectopic  0   Multiple  0   Live Births  0              Patient Active Problem List   Diagnosis Date Noted   Headache(784.0) 07/08/2013   Chest pain 10/17/2012    Past Medical History:  Diagnosis Date   Migraines    with aura   Vertigo     Past Surgical History:  Procedure Laterality Date   None      Current Outpatient Medications  Medication Sig Dispense Refill   amitriptyline (ELAVIL) 25 MG tablet Take 25 mg by mouth at bedtime.     cetirizine (ZYRTEC) 10 MG tablet Take 10 mg by mouth daily.     FLUoxetine (PROZAC) 10 MG capsule Take 10 mg by mouth daily.     levonorgestrel (MIRENA) 20 MCG/DAY IUD 1 each by Intrauterine route once.     SUMAtriptan (IMITREX) 25 MG tablet Take by mouth.     No current facility-administered medications for this visit.     ALLERGIES: Patient has no known allergies.  History reviewed. No pertinent family history.  Social History   Socioeconomic History   Marital status: Single    Spouse name: Not on file   Number of children: 0   Years of education: college   Highest education level: Not on file  Occupational History   Occupation: [employer]  Tobacco Use   Smoking status: Never   Smokeless tobacco: Never   Vaping Use   Vaping Use: Never used  Substance and Sexual Activity   Alcohol use: Yes    Comment: Occas   Drug use: No   Sexual activity: Yes    Birth control/protection: Pill  Other Topics Concern   Not on file  Social History Narrative   Patient is single.   Caffeine-None   Patient lives at home with parents.    Patient is in college.    Patient has no children.    Social Determinants of Health   Financial Resource Strain: Not on file  Food Insecurity: Not on file  Transportation Needs: Not on file  Physical Activity: Not on file  Stress: Not on file  Social Connections: Not on file  Intimate Partner Violence: Not on file    Review of Systems  All other systems reviewed and are negative.   PHYSICAL EXAMINATION:    BP 100/66   Ht 5\' 3"  (1.6 m)   Wt 149 lb (67.6 kg)   BMI 26.39  kg/m     General appearance: alert, cooperative and appears stated age   Pelvic: External genitalia:  no lesions              Urethra:  normal appearing urethra with no masses, tenderness or lesions              Bartholins and Skenes: normal                 Vagina: normal appearing vagina with normal color and discharge, no lesions              Cervix: no lesions.  IUD strings noted, almost 3 cm length.  Menstrual flow noted.                 Bimanual Exam:  Uterus:  normal size, contour, position, consistency, mobility, non-tender              Adnexa: no mass, fullness, tenderness      Chaperone was present for exam:  Marchelle Folks, CMA  ASSESSMENT  IUD check up.  Doing well.  PLAN  Reassurance regarding IUD position.  Bleeding profiles with Mirena reviewed.  Follow up for yearly annual exams and prn.    An After Visit Summary was printed and given to the patient.  10 min  total time was spent for this patient encounter, including preparation, face-to-face counseling with the patient, coordination of care, and documentation of the encounter.

## 2021-10-25 ENCOUNTER — Encounter: Payer: Self-pay | Admitting: Obstetrics and Gynecology

## 2021-10-25 ENCOUNTER — Ambulatory Visit: Payer: Managed Care, Other (non HMO) | Admitting: Obstetrics and Gynecology

## 2021-10-25 VITALS — BP 100/66 | Ht 63.0 in | Wt 149.0 lb

## 2021-10-25 DIAGNOSIS — Z30431 Encounter for routine checking of intrauterine contraceptive device: Secondary | ICD-10-CM

## 2022-04-30 ENCOUNTER — Ambulatory Visit: Payer: Managed Care, Other (non HMO) | Admitting: Nurse Practitioner

## 2022-06-04 ENCOUNTER — Encounter: Payer: Self-pay | Admitting: Nurse Practitioner

## 2022-06-05 ENCOUNTER — Ambulatory Visit (INDEPENDENT_AMBULATORY_CARE_PROVIDER_SITE_OTHER): Payer: Managed Care, Other (non HMO) | Admitting: Nurse Practitioner

## 2022-06-05 ENCOUNTER — Other Ambulatory Visit (HOSPITAL_COMMUNITY)
Admission: RE | Admit: 2022-06-05 | Discharge: 2022-06-05 | Disposition: A | Discharge status: Home / Self Care | Payer: Managed Care, Other (non HMO) | Source: Ambulatory Visit | Attending: Nurse Practitioner | Admitting: Nurse Practitioner

## 2022-06-05 ENCOUNTER — Encounter: Payer: Self-pay | Admitting: Nurse Practitioner

## 2022-06-05 VITALS — BP 100/62 | HR 81 | Ht 62.75 in | Wt 164.0 lb

## 2022-06-05 DIAGNOSIS — Z01419 Encounter for gynecological examination (general) (routine) without abnormal findings: Secondary | ICD-10-CM | POA: Diagnosis not present

## 2022-06-05 DIAGNOSIS — Z124 Encounter for screening for malignant neoplasm of cervix: Secondary | ICD-10-CM

## 2022-06-05 DIAGNOSIS — Z30431 Encounter for routine checking of intrauterine contraceptive device: Secondary | ICD-10-CM | POA: Diagnosis not present

## 2022-06-05 NOTE — Progress Notes (Signed)
   [facility] 1995-12-09 [id number]   History:  26 y.o. G0 presents for annual exam. H/O irregular cycles. Mirena IUD 09/2021. Tried a couple different OCPs and ortho evra patch in the past with no improvement in bleeding. She does feel bleeding is better with IUD. Normal pelvic ultrasound 05/2021. Mother had hysterectomy d/t AUB that she thinks was caused by fibroids.  Normal pap history. Has received Gardasil series. PCP managing migraines.   Gynecologic History No LMP recorded. (Menstrual status: IUD). Period Pattern:  (spotting monthly with mirena iud) Menstrual Flow: Light Menstrual Control: Thin pad, Tampon Dysmenorrhea:  (mild to moderate cramping) Contraception/Family planning: IUD Sexually active: Yes  Health Maintenance Last Pap: 04/20/2019. Results were: Normal, 3-year repeat Last mammogram: Not indicated Last colonoscopy: Not indicated Last Dexa: Not indicated  Past medical history, past surgical history, family history and social history were all reviewed and documented in the EPIC chart. 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 Just moved into custom built home in February.   ROS:  A ROS was performed and pertinent positives and negatives are included.  Exam:  Vitals:   06/05/22 1006  BP: 100/62  Pulse: 81  SpO2: 99%  Weight: 164 lb (74.4 kg)  Height: 5' 2.75" (1.594 m)     Body mass index is 29.28 kg/m.  General appearance:  Normal Thyroid:  Symmetrical, normal in size, without palpable masses or nodularity. Respiratory  Auscultation:  Clear without wheezing or rhonchi Cardiovascular  Auscultation:  Regular rate, without rubs, murmurs or gallops  Edema/varicosities:  Not grossly evident Abdominal  Soft,nontender, without masses, guarding or rebound.  Liver/spleen:  No organomegaly noted  Hernia:  None appreciated  Skin  Inspection:  Grossly normal   Breasts: Examined lying and sitting.   Right: Without masses,  retractions, discharge or axillary adenopathy.   Left: Without masses, retractions, discharge or axillary adenopathy. Genitourinary   Inguinal/mons:  Normal without inguinal adenopathy  External genitalia:  Normal appearing vulva with no masses, tenderness, or lesions  BUS/Urethra/Skene's glands:  Normal  Vagina:  Normal appearing with normal color and discharge, no lesions  Cervix:  Normal appearing without discharge or lesions. IUD string visible  Uterus:  Normal in size, shape and contour.  Midline and mobile, nontender  Adnexa/parametria:     Rt: Normal in size, without masses or tenderness.   Lt: Normal in size, without masses or tenderness.  Anus and perineum: Normal  Digital rectal exam: Not indicated  Patient informed chaperone available to be present for breast and pelvic exam. Patient has requested no chaperone to be present. Patient has been advised what will be completed during breast and pelvic exam.   Assessment/Plan:  27 y.o. G0 for annual exam.   Well female exam with routine gynecological exam - Education provided on SBEs, importance of preventative screenings, current guidelines, high calcium diet, regular exercise, and multivitamin daily.   Encounter for routine checking of intrauterine contraceptive device (IUD). Mirena IUD 09/2021. Normal IUD exam today.   Screening for cervical cancer - Plan: Cytology - PAP( [place]). Normal pap history.   Follow-up in 1 year for annual.    Tamela Gammon [facility], 10:24 AM 06/05/2022

## 2022-06-06 LAB — CYTOLOGY - PAP: Diagnosis: NEGATIVE

## 2023-06-06 ENCOUNTER — Ambulatory Visit (INDEPENDENT_AMBULATORY_CARE_PROVIDER_SITE_OTHER): Payer: Managed Care, Other (non HMO) | Admitting: Nurse Practitioner

## 2023-06-06 ENCOUNTER — Encounter: Payer: Self-pay | Admitting: Nurse Practitioner

## 2023-06-06 VITALS — BP 124/76 | HR 71 | Ht 63.0 in | Wt 155.0 lb

## 2023-06-06 DIAGNOSIS — Z30432 Encounter for removal of intrauterine contraceptive device: Secondary | ICD-10-CM

## 2023-06-06 DIAGNOSIS — Z3169 Encounter for other general counseling and advice on procreation: Secondary | ICD-10-CM | POA: Diagnosis not present

## 2023-06-06 DIAGNOSIS — Z01419 Encounter for gynecological examination (general) (routine) without abnormal findings: Secondary | ICD-10-CM | POA: Diagnosis not present

## 2023-06-06 NOTE — Progress Notes (Signed)
   [facility] 1996-01-13 [id number]   History:  28 y.o. G0 presents for annual exam. Mirena IUD 09/2021. Would like IUD removed today. Planning to try to conceive. H/O irregular periods. Normal pap history. Has received Gardasil series. PCP managing migraines.   Gynecologic History Patient's last menstrual period was 06/02/2023. Period Duration (Days): 2 Period Pattern: (!) Irregular Menstrual Flow: Light Menstrual Control: Tampon, Panty liner Dysmenorrhea: (!) Moderate Contraception/Family planning: IUD Sexually active: Yes  Health Maintenance Last Pap: 06/05/2022. Results were: Normal Last mammogram: Not indicated Last colonoscopy: Not indicated Last Dexa: Not indicated  Past medical history, past surgical history, family history and social history were all reviewed and documented in the EPIC chart. 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   ROS:  A ROS was performed and pertinent positives and negatives are included.  Exam:  Vitals:   06/06/23 0857  BP: 124/76  Pulse: 71  SpO2: 98%  Weight: 155 lb (70.3 kg)  Height: 5\' 3"  (1.6 m)      Body mass index is 27.46 kg/m.  General appearance:  Normal Thyroid:  Symmetrical, normal in size, without palpable masses or nodularity. Respiratory  Auscultation:  Clear without wheezing or rhonchi Cardiovascular  Auscultation:  Regular rate, without rubs, murmurs or gallops  Edema/varicosities:  Not grossly evident Abdominal  Soft,nontender, without masses, guarding or rebound.  Liver/spleen:  No organomegaly noted  Hernia:  None appreciated  Skin  Inspection:  Grossly normal   Breasts: Examined lying and sitting.   Right: Without masses, retractions, discharge or axillary adenopathy.   Left: Without masses, retractions, discharge or axillary adenopathy. Pelvic: External genitalia:  no lesions              Urethra:  normal appearing urethra with no masses, tenderness or lesions              Bartholins and  Skenes: normal                 Vagina: normal appearing vagina with normal color and discharge, no lesions              Cervix: no lesions. IUD strings grasped with ring forceps and removed with ease Bimanual Exam:  Uterus:  no masses or tenderness              Adnexa: no mass, fullness, tenderness              Rectovaginal: Deferred              Anus:  normal, no lesions  Patient informed chaperone available to be present for breast and pelvic exam. Patient has requested no chaperone to be present. Patient has been advised what will be completed during breast and pelvic exam.   Assessment/Plan:  28 y.o. G0 for annual exam.   Well female exam with routine gynecological exam - Education provided on SBEs, importance of preventative screenings, current guidelines, high calcium diet, regular exercise, and multivitamin daily. Labs with PCP.   Encounter for IUD removal - Plan: IUD removal. Planning to try to conceive. Tolerated well.  Encounter for preconception consultation - IUD removed today. H/O irregular periods. Use app to track cycles. Follow up in 3-6 months if cycles are irregular. PNV.   Screening for cervical cancer - Normal pap history. Will repeat at 3-year interval per guidelines.   Return in about 1 year (around 06/05/2024) for Annual.    [name] [facility], 9:22 AM 06/06/2023

## 2023-07-23 ENCOUNTER — Ambulatory Visit: Admitting: Nurse Practitioner
